# Patient Record
Sex: Male | Born: 1995 | Race: Black or African American | Hispanic: No | Marital: Single | Smoking: Never smoker
Health system: Southern US, Community
[De-identification: ages and names within clinical notes are randomized; demographics above are authoritative.]

## PROBLEM LIST (undated history)

## (undated) DIAGNOSIS — D571 Sickle-cell disease without crisis: Secondary | ICD-10-CM

---

## 2017-04-26 ENCOUNTER — Emergency Department (HOSPITAL_COMMUNITY): Payer: Medicaid Other

## 2017-04-26 ENCOUNTER — Emergency Department (HOSPITAL_COMMUNITY)
Admission: EM | Admit: 2017-04-26 | Discharge: 2017-04-26 | Disposition: A | Discharge status: Home / Self Care | Payer: Medicaid Other | Attending: Emergency Medicine | Admitting: Emergency Medicine

## 2017-04-26 ENCOUNTER — Encounter (HOSPITAL_COMMUNITY): Payer: Self-pay | Admitting: Emergency Medicine

## 2017-04-26 DIAGNOSIS — D5700 Hb-SS disease with crisis, unspecified: Secondary | ICD-10-CM | POA: Diagnosis not present

## 2017-04-26 HISTORY — DX: Sickle-cell disease without crisis: D57.1

## 2017-04-26 LAB — CBC WITH DIFFERENTIAL/PLATELET
BASOS PCT: 0 %
Basophils Absolute: 0 10*3/uL (ref 0.0–0.1)
EOS ABS: 0.1 10*3/uL (ref 0.0–0.7)
Eosinophils Relative: 1 %
HCT: 34.1 % — ABNORMAL LOW (ref 39.0–52.0)
HEMOGLOBIN: 11.4 g/dL — AB (ref 13.0–17.0)
Lymphocytes Relative: 26 %
Lymphs Abs: 2.5 10*3/uL (ref 0.7–4.0)
MCH: 23.7 pg — ABNORMAL LOW (ref 26.0–34.0)
MCHC: 33.4 g/dL (ref 30.0–36.0)
MCV: 70.9 fL — ABNORMAL LOW (ref 78.0–100.0)
MONOS PCT: 12 %
Monocytes Absolute: 1.2 10*3/uL — ABNORMAL HIGH (ref 0.1–1.0)
NEUTROS PCT: 61 %
Neutro Abs: 6.2 10*3/uL (ref 1.7–7.7)
Platelets: 140 10*3/uL — ABNORMAL LOW (ref 150–400)
RBC: 4.81 MIL/uL (ref 4.22–5.81)
RDW: 15.8 % — AB (ref 11.5–15.5)
WBC: 10 10*3/uL (ref 4.0–10.5)

## 2017-04-26 LAB — COMPREHENSIVE METABOLIC PANEL
ALK PHOS: 107 U/L (ref 38–126)
ALT: 85 U/L — AB (ref 17–63)
AST: 62 U/L — ABNORMAL HIGH (ref 15–41)
Albumin: 4.1 g/dL (ref 3.5–5.0)
Anion gap: 8 (ref 5–15)
BILIRUBIN TOTAL: 1.7 mg/dL — AB (ref 0.3–1.2)
BUN: 8 mg/dL (ref 6–20)
CALCIUM: 9.2 mg/dL (ref 8.9–10.3)
CO2: 22 mmol/L (ref 22–32)
CREATININE: 0.81 mg/dL (ref 0.61–1.24)
Chloride: 107 mmol/L (ref 101–111)
GFR calc non Af Amer: >60 mL/min (ref >60)
Glucose, Bld: 103 mg/dL — ABNORMAL HIGH (ref 65–99)
Potassium: 3.9 mmol/L (ref 3.5–5.1)
SODIUM: 137 mmol/L (ref 135–145)
TOTAL PROTEIN: 7.4 g/dL (ref 6.5–8.1)

## 2017-04-26 LAB — I-STAT TROPONIN, ED
TROPONIN I, POC: 0 ng/mL (ref 0.00–0.08)
Troponin i, poc: 0 ng/mL (ref 0.00–0.08)

## 2017-04-26 LAB — RETICULOCYTES
RBC.: 4.81 MIL/uL (ref 4.22–5.81)
Retic Count, Absolute: 206.8 10*3/uL — ABNORMAL HIGH (ref 19.0–186.0)
Retic Ct Pct: 4.3 % — ABNORMAL HIGH (ref 0.4–3.1)

## 2017-04-26 LAB — D-DIMER, QUANTITATIVE: D-Dimer, Quant: 1.91 ug/mL-FEU — ABNORMAL HIGH (ref 0.00–0.50)

## 2017-04-26 MED ORDER — ONDANSETRON HCL 4 MG/2ML IJ SOLN: 4.0000 mg | INTRAMUSCULAR | Status: DC | PRN

## 2017-04-26 MED ORDER — HYDROMORPHONE HCL 1 MG/ML IJ SOLN: 0.5000 mg | INTRAMUSCULAR | Status: AC

## 2017-04-26 MED ORDER — HYDROMORPHONE HCL 1 MG/ML IJ SOLN: 1.0000 mg | INTRAMUSCULAR | Status: DC

## 2017-04-26 MED ORDER — IOPAMIDOL (ISOVUE-370) INJECTION 76%
INTRAVENOUS | Status: AC
  Administered 2017-04-26: 100 mL
  Filled 2017-04-26: qty 100

## 2017-04-26 MED ORDER — GI COCKTAIL ~~LOC~~
30.0000 mL | Freq: Once | ORAL | Status: AC
  Administered 2017-04-26: 30 mL via ORAL
  Filled 2017-04-26: qty 30

## 2017-04-26 MED ORDER — HYDROCODONE-ACETAMINOPHEN 5-325 MG PO TABS
1.0000 | ORAL_TABLET | Freq: Once | ORAL | Status: AC
  Administered 2017-04-26: 1 via ORAL
  Filled 2017-04-26: qty 1

## 2017-04-26 MED ORDER — HYDROMORPHONE HCL 1 MG/ML IJ SOLN
0.5000 mg | INTRAMUSCULAR | Status: AC
  Administered 2017-04-26: 0.5 mg via INTRAVENOUS
  Filled 2017-04-26: qty 1

## 2017-04-26 MED ORDER — DIPHENHYDRAMINE HCL 50 MG/ML IJ SOLN
25.0000 mg | Freq: Once | INTRAMUSCULAR | Status: AC
  Administered 2017-04-26: 25 mg via INTRAVENOUS
  Filled 2017-04-26: qty 1

## 2017-04-26 MED ORDER — SODIUM CHLORIDE 0.45 % IV SOLN
INTRAVENOUS | Status: DC
  Administered 2017-04-26: 09:00:00 via INTRAVENOUS

## 2017-04-26 MED ORDER — HYDROMORPHONE HCL 1 MG/ML IJ SOLN: 0.5000 mg | Freq: Once | INTRAMUSCULAR | Status: DC

## 2017-04-26 MED ORDER — HYDROMORPHONE HCL 1 MG/ML IJ SOLN
1.0000 mg | INTRAMUSCULAR | Status: AC
  Administered 2017-04-26: 1 mg via INTRAVENOUS
  Filled 2017-04-26: qty 1

## 2017-04-26 MED ORDER — KETOROLAC TROMETHAMINE 15 MG/ML IJ SOLN
15.0000 mg | INTRAMUSCULAR | Status: AC
  Administered 2017-04-26: 15 mg via INTRAVENOUS
  Filled 2017-04-26: qty 1

## 2017-04-26 MED ORDER — HYDROMORPHONE HCL 1 MG/ML IJ SOLN
0.5000 mg | INTRAMUSCULAR | Status: AC
  Administered 2017-04-26: 0.5 mg via INTRAVENOUS

## 2017-04-26 MED ORDER — HYDROMORPHONE HCL 1 MG/ML IJ SOLN: 0.5000 mg | INTRAMUSCULAR | Status: DC

## 2017-04-26 MED ORDER — HYDROMORPHONE HCL 1 MG/ML IJ SOLN: 1.0000 mg | INTRAMUSCULAR | Status: AC

## 2017-04-26 MED ORDER — HYDROMORPHONE HCL 1 MG/ML IJ SOLN
1.0000 mg | INTRAMUSCULAR | Status: DC
  Filled 2017-04-26: qty 1

## 2017-04-26 MED ORDER — IOPAMIDOL (ISOVUE-370) INJECTION 76%
INTRAVENOUS | Status: AC
  Filled 2017-04-26: qty 100

## 2017-04-26 NOTE — ED Notes (Signed)
ED Provider at bedside. 

## 2017-04-26 NOTE — ED Notes (Signed)
CT contacted about when they will be able to get patient over for scan, Ricky Cantrell in CT advised there are 3 other patients ahead of patient at this time. PA Leaphart made aware.

## 2017-04-26 NOTE — ED Triage Notes (Signed)
Pt arrives via [facility] from home, ems reports pt originally called out for chest pain but after they spoke with patient more he admitted he had pain all over due to sickle cell. Pt reports last crisis was last week.

## 2017-04-26 NOTE — ED Provider Notes (Signed)
[facility]-EMERGENCY DEPT Provider Note   CSN: [id number] Arrival date & time: 04/26/17  0754     History   Chief Complaint Chief Complaint  Patient presents with  . Sickle Cell Pain Crisis    HPI Ricky Cantrell is a 21 y.o. male.  HPI 21 year old African-American male past medical history significant for sickle cell anemia presents to the emergency Department today with complaints of all over body pain and chest pain. Patient was at orientation for college when he began having all of her pain and specifically chest pain that sent him to the ED for evaluation by EMS. Mom at bedside states that patient did have his first sickle cell crisis last week and was admitted to the hospital at [facility] in [place] for 2-3 days. He was discharged with follow-up with hematologist. Patient has a scheduled appointment on Tuesday of next week. States that the chest pain is substernal and does not radiate. Describes this pressure. Associated with diaphoresis, nausea, emesis, shortness of breath. Does complain of allover body pain specifically in his bilateral arms and legs which is where his pain was located to his crisis last week. Has not tried anything of his home narcotic medications for the pain. Denies any lower extremity edema or calf tenderness. Denies any history of DVT/PE, prolonged immobilization, tobacco use. Pt denies any fever, chill, ha, vision changes, lightheadedness, dizziness, congestion, neck pain, sob, cough, abd pain, n/v/d, urinary symptoms, change in bowel habits, melena, hematochezia, lower extremity paresthesias.  Past Medical History:  Diagnosis Date  . Sickle cell anemia (HCC)     There are no active problems to display for this patient.   History reviewed. No pertinent surgical history.     Home Medications    Prior to Admission medications   Not on File    Family History No family history on file.  Social History Social History  Substance Use Topics  . Smoking status:  Not on file  . Smokeless tobacco: Not on file  . Alcohol use Not on file     Allergies   Patient has no known allergies.   Review of Systems Review of Systems  Constitutional: Negative for chills, diaphoresis and fever.  HENT: Negative for congestion.   Eyes: Negative for visual disturbance.  Respiratory: Negative for cough and shortness of breath.   Cardiovascular: Positive for chest pain. Negative for palpitations and leg swelling.  Gastrointestinal: Negative for abdominal pain, diarrhea, nausea and vomiting.  Genitourinary: Negative for dysuria, flank pain, frequency, hematuria and urgency.  Musculoskeletal: Positive for arthralgias and myalgias.  Skin: Negative for rash.  Neurological: Negative for dizziness, syncope, weakness, light-headedness, numbness and headaches.  Psychiatric/Behavioral: Negative for sleep disturbance. The patient is not nervous/anxious.      Physical Exam Updated Vital Signs BP 117/70   Pulse 71   Temp 98.3 F (36.8 C) (Oral)   Resp (!) 21   SpO2 97%   Physical Exam  Constitutional: He is oriented to person, place, and time. He appears well-developed and well-nourished.  Non-toxic appearance. No distress.  HENT:  Head: Normocephalic and atraumatic.  Nose: Nose normal.  Mouth/Throat: Oropharynx is clear and moist.  Eyes: Conjunctivae and EOM are normal. Pupils are equal, round, and reactive to light. Right eye exhibits no discharge. Left eye exhibits no discharge.  Neck: Normal range of motion. Neck supple. No JVD present. No tracheal deviation present.  Cardiovascular: Normal rate, regular rhythm, normal heart sounds and intact distal pulses.   Pulmonary/Chest: Effort normal and breath  sounds normal. No respiratory distress. He exhibits no tenderness.  No hypoxia or tachypnea.  Abdominal: Soft. Bowel sounds are normal. He exhibits no distension. There is no tenderness. There is no rebound and no guarding.  obese  Musculoskeletal: Normal  range of motion.  No lower extremity edema or calf tenderness.  Joints not any erythema or edema. Full range of motion. Extremity compartments are soft and nontender.  Lymphadenopathy:    He has no cervical adenopathy.  Neurological: He is alert and oriented to person, place, and time.  Skin: Skin is warm and dry. Capillary refill takes less than 2 seconds. He is not diaphoretic.  Psychiatric: His behavior is normal. Judgment and thought content normal.  Nursing note and vitals reviewed.    ED Treatments / Results  Labs (all labs ordered are listed, but only abnormal results are displayed) Labs Reviewed  RETICULOCYTES - Abnormal; Notable for the following:       Result Value   Retic Ct Pct 4.3 (*)    Retic Count, Absolute 206.8 (*)    All other components within normal limits  COMPREHENSIVE METABOLIC PANEL - Abnormal; Notable for the following:    Glucose, Bld 103 (*)    AST 62 (*)    ALT 85 (*)    Total Bilirubin 1.7 (*)    All other components within normal limits  CBC WITH DIFFERENTIAL/PLATELET - Abnormal; Notable for the following:    Hemoglobin 11.4 (*)    HCT 34.1 (*)    MCV 70.9 (*)    MCH 23.7 (*)    RDW 15.8 (*)    Platelets 140 (*)    Monocytes Absolute 1.2 (*)    All other components within normal limits  D-DIMER, QUANTITATIVE (NOT AT [facility]) - Abnormal; Notable for the following:    D-Dimer, Quant 1.91 (*)    All other components within normal limits  I-STAT TROPOININ, ED  I-STAT TROPOININ, ED    EKG  EKG Interpretation  Date/Time:  Friday April 26 2017 07:58:33 EDT Ventricular Rate:  83 PR Interval:    QRS Duration: 105 QT Interval:  392 QTC Calculation: 461 R Axis:   49 Text Interpretation:  Sinus rhythm RSR' in V1 or V2, right VCD or RVH Confirmed by Geoffery LyonseLo, Douglas ([id number]) on 04/26/2017 8:05:17 AM       Radiology No results found.  Procedures Procedures (including critical care time)  Medications Ordered in ED Medications  ketorolac  (TORADOL) 15 MG/ML injection 15 mg (15 mg Intravenous Given 04/26/17 0901)  HYDROmorphone (DILAUDID) injection 1 mg (1 mg Intravenous Given 04/26/17 1150)    Or  HYDROmorphone (DILAUDID) injection 1 mg ( Subcutaneous See Alternative 04/26/17 1150)  diphenhydrAMINE (BENADRYL) injection 25 mg (25 mg Intravenous Given 04/26/17 0901)  HYDROmorphone (DILAUDID) injection 0.5 mg (0.5 mg Intravenous Given 04/26/17 0901)    Or  HYDROmorphone (DILAUDID) injection 0.5 mg ( Subcutaneous See Alternative 04/26/17 0901)  HYDROmorphone (DILAUDID) injection 0.5 mg (0.5 mg Intravenous Given 04/26/17 1031)    Or  HYDROmorphone (DILAUDID) injection 0.5 mg ( Subcutaneous See Alternative 04/26/17 1031)  gi cocktail (Maalox,Lidocaine,Donnatal) (30 mLs Oral Given 04/26/17 1150)  iopamidol (ISOVUE-370) 76 % injection (100 mLs  Contrast Given 04/26/17 1518)  HYDROcodone-acetaminophen (NORCO/VICODIN) 5-325 MG per tablet 1 tablet (1 tablet Oral Given 04/26/17 1607)     Initial Impression / Assessment and Plan / ED Course  I have reviewed the triage vital signs and the nursing notes.  Pertinent labs & imaging results that were  available during my care of the patient were reviewed by me and considered in my medical decision making (see chart for details).     Patient presents to emergency Department with complaints of sickle cell crisis pain. Pain was all over but specifically in his chest. He was admitted to the hospital last week for his first sickle cell crisis. Was discharged with pain medicine and outpatient follow-up. States that he did improve until today. Has never had chest pain or acute chest syndrome. Denies any cough or fever. Chest x-ray shows no signs of focal infiltrate concerning for pneumonia. Hemoglobin is stable at baseline. No leukocytosis. Mild thrombocytopenia which is at baseline. Increased reticulocyte count. Labs seem consistent with acute sickle cell crisis. EKG without any ischemic changes. Negative delta  troponins. Given patient's new complaint of chest pain and recent hospitalization d-dimer was ordered. D-dimer is positive. CTA of chest was performed that demonstrated no significant pulmonary embolism. Reading does note suboptimal views of the su however I have low suspicion for PE at this time. Patient is not hypoxic, no tachypnea, no tachycardia. EKG reveals no right heart strain. Patient's pain has improved with narcotic pain medicine. He feels back at baseline. Mom and patient feels that he can go home and manage with his pain medicine at home with follow-up with his hematologist this week. Patient has no further evidence of emergent management condition at this time. He is able to ambulate for gait. He'll tolerate by mouth fluids.  Pt is hemodynamically stable, in NAD, & able to ambulate in the ED. Evaluation does not show pathology that would require ongoing emergent intervention or inpatient treatment. I explained the diagnosis to the patient. Pain has been managed & has no complaints prior to dc. Pt is comfortable with above plan and is stable for discharge at this time. All questions were answered prior to disposition. Strict return precautions for f/u to the ED were discussed. Encouraged follow up with PCP. Discussed with Dr. Judd Lien who is agreeable to above plan.   Final Clinical Impressions(s) / ED Diagnoses   Final diagnoses:  Sickle cell pain crisis [facility])    New Prescriptions Discharge Medication List as of 04/26/2017  4:46 PM       Rise Mu, PA-C 04/28/17 1635    Geoffery Lyons, MD 05/06/17 1501

## 2017-04-26 NOTE — ED Notes (Signed)
Patient given apple juice, tolerated well.

## 2017-04-26 NOTE — ED Notes (Signed)
Patient transported to X-ray 

## 2017-04-26 NOTE — Discharge Instructions (Signed)
Labs and imaging reassuring. No signs of heart attack. No evidence of blood clot. Make sure he takes his pain medicine at home. He to follow up with hematologist next week. Return to the ED if any symptoms changes including developing fever, worsening chest pain redevelops shortness of breath.

## 2017-07-22 ENCOUNTER — Ambulatory Visit: Payer: Self-pay | Admitting: Family Medicine

## 2017-08-06 ENCOUNTER — Encounter (HOSPITAL_COMMUNITY): Payer: Self-pay | Admitting: Emergency Medicine

## 2017-08-06 ENCOUNTER — Emergency Department (HOSPITAL_COMMUNITY)
Admission: EM | Admit: 2017-08-06 | Discharge: 2017-08-06 | Disposition: A | Discharge status: Home / Self Care | Payer: Medicaid Other | Attending: Emergency Medicine | Admitting: Emergency Medicine

## 2017-08-06 DIAGNOSIS — M79605 Pain in left leg: Secondary | ICD-10-CM | POA: Diagnosis present

## 2017-08-06 DIAGNOSIS — D5700 Hb-SS disease with crisis, unspecified: Secondary | ICD-10-CM | POA: Insufficient documentation

## 2017-08-06 LAB — CBC WITH DIFFERENTIAL/PLATELET
BASOS ABS: 0 10*3/uL (ref 0.0–0.1)
Basophils Relative: 0 %
Eosinophils Absolute: 0.1 10*3/uL (ref 0.0–0.7)
Eosinophils Relative: 1 %
HEMATOCRIT: 34.5 % — AB (ref 39.0–52.0)
HEMOGLOBIN: 11.7 g/dL — AB (ref 13.0–17.0)
LYMPHS PCT: 26 %
Lymphs Abs: 2.4 10*3/uL (ref 0.7–4.0)
MCH: 24.2 pg — ABNORMAL LOW (ref 26.0–34.0)
MCHC: 33.9 g/dL (ref 30.0–36.0)
MCV: 71.3 fL — AB (ref 78.0–100.0)
Monocytes Absolute: 0.7 10*3/uL (ref 0.1–1.0)
Monocytes Relative: 7 %
NEUTROS ABS: 6 10*3/uL (ref 1.7–7.7)
Neutrophils Relative %: 65 %
Platelets: 148 10*3/uL — ABNORMAL LOW (ref 150–400)
RBC: 4.84 MIL/uL (ref 4.22–5.81)
RDW: 16.4 % — ABNORMAL HIGH (ref 11.5–15.5)
WBC: 9.2 10*3/uL (ref 4.0–10.5)

## 2017-08-06 LAB — COMPREHENSIVE METABOLIC PANEL
ALK PHOS: 111 U/L (ref 38–126)
ALT: 72 U/L — AB (ref 17–63)
AST: 51 U/L — AB (ref 15–41)
Albumin: 4.2 g/dL (ref 3.5–5.0)
Anion gap: 9 (ref 5–15)
BILIRUBIN TOTAL: 1.3 mg/dL — AB (ref 0.3–1.2)
BUN: 11 mg/dL (ref 6–20)
CHLORIDE: 105 mmol/L (ref 101–111)
CO2: 24 mmol/L (ref 22–32)
CREATININE: 0.84 mg/dL (ref 0.61–1.24)
Calcium: 9.2 mg/dL (ref 8.9–10.3)
GFR calc Af Amer: >60 mL/min (ref >60)
Glucose, Bld: 131 mg/dL — ABNORMAL HIGH (ref 65–99)
Potassium: 4 mmol/L (ref 3.5–5.1)
Sodium: 138 mmol/L (ref 135–145)
Total Protein: 7.7 g/dL (ref 6.5–8.1)

## 2017-08-06 LAB — RETICULOCYTES
RBC.: 4.84 MIL/uL (ref 4.22–5.81)
RETIC CT PCT: 4.5 % — AB (ref 0.4–3.1)
Retic Count, Absolute: 217.8 10*3/uL — ABNORMAL HIGH (ref 19.0–186.0)

## 2017-08-06 MED ORDER — SODIUM CHLORIDE 0.45 % IV SOLN
INTRAVENOUS | Status: DC
  Administered 2017-08-06: 05:00:00 via INTRAVENOUS

## 2017-08-06 MED ORDER — OXYCODONE-ACETAMINOPHEN 5-325 MG PO TABS
1.0000 | ORAL_TABLET | Freq: Once | ORAL | Status: AC
  Administered 2017-08-06: 1 via ORAL
  Filled 2017-08-06: qty 1

## 2017-08-06 MED ORDER — HYDROMORPHONE HCL 1 MG/ML IJ SOLN
1.0000 mg | Freq: Once | INTRAMUSCULAR | Status: AC
  Administered 2017-08-06: 1 mg via INTRAVENOUS
  Filled 2017-08-06: qty 1

## 2017-08-06 NOTE — ED Provider Notes (Signed)
[facility]-EMERGENCY DEPT Provider Note   CSN: [id number] Arrival date & time: 08/06/17  4010     History   Chief Complaint Chief Complaint  Patient presents with  . Sickle Cell Pain Crisis    HPI Ricky Cantrell is a 21 y.o. male.  The history is provided by the patient and medical records.  Sickle Cell Pain Crisis     21 year old male with history of sickle cell anemia, presenting to the ED with left leg pain.  Patient states pain mostly in the left upper leg, began a few hours ago.  States pain is throbbing in nature. He also feels like pain is making him "jittery". No seizure activity. He denies any swelling or fever. No history of DVT. No chest pain, shortness of breath, cough, or other upper respiratory symptoms. Patient states he takes ibuprofen at home as well as oxycodone as needed. He did not try taking his home meds prior to coming here.  States his symptoms feels typical of his sickle cell pain crisis.  Reports he does not get these frequently, usually every few months or so.  Past Medical History:  Diagnosis Date  . Sickle cell anemia (HCC)     There are no active problems to display for this patient.   History reviewed. No pertinent surgical history.     Home Medications    Prior to Admission medications   Medication Sig Start Date End Date Taking? Authorizing Provider  folic acid (FOLVITE) 1 MG tablet Take 1 mg by mouth daily.    [provider]    Family History History reviewed. No pertinent family history.  Social History Social History  Substance Use Topics  . Smoking status: Never Smoker  . Smokeless tobacco: Never Used  . Alcohol use Not on file     Allergies   Patient has no known allergies.   Review of Systems Review of Systems  Musculoskeletal: Positive for arthralgias.  All other systems reviewed and are negative.    Physical Exam Updated Vital Signs BP 137/66 (BP Location: Left Arm)   Pulse 82   Temp 98.3 F (36.8 C)  (Oral)   Resp 20   SpO2 98%   Physical Exam  Constitutional: He is oriented to person, place, and time. He appears well-developed and well-nourished.  Obese, NAD  HENT:  Head: Normocephalic and atraumatic.  Mouth/Throat: Oropharynx is clear and moist.  Eyes: Pupils are equal, round, and reactive to light. Conjunctivae and EOM are normal.  Neck: Normal range of motion.  Cardiovascular: Normal rate, regular rhythm and normal heart sounds.   Pulmonary/Chest: Effort normal and breath sounds normal.  Abdominal: Soft. Bowel sounds are normal.  Musculoskeletal: Normal range of motion.  Left leg without apparent swelling or erythema, no bony deformities, no focal tendernes  Neurological: He is alert and oriented to person, place, and time.  Skin: Skin is warm and dry.  Psychiatric: He has a normal mood and affect.  Nursing note and vitals reviewed.    ED Treatments / Results  Labs (all labs ordered are listed, but only abnormal results are displayed) Labs Reviewed  CBC WITH DIFFERENTIAL/PLATELET - Abnormal; Notable for the following:       Result Value   Hemoglobin 11.7 (*)    HCT 34.5 (*)    MCV 71.3 (*)    MCH 24.2 (*)    RDW 16.4 (*)    Platelets 148 (*)    All other components within normal limits  COMPREHENSIVE METABOLIC PANEL -  Abnormal; Notable for the following:    Glucose, Bld 131 (*)    AST 51 (*)    ALT 72 (*)    Total Bilirubin 1.3 (*)    All other components within normal limits  RETICULOCYTES - Abnormal; Notable for the following:    Retic Ct Pct 4.5 (*)    Retic Count, Absolute 217.8 (*)    All other components within normal limits    EKG  EKG Interpretation None       Radiology No results found.  Procedures Procedures (including critical care time)  Medications Ordered in ED Medications  0.45 % sodium chloride infusion ( Intravenous New Bag/Given 08/06/17 0442)  HYDROmorphone (DILAUDID) injection 1 mg (1 mg Intravenous Given 08/06/17 0442)    oxyCODONE-acetaminophen (PERCOCET/ROXICET) 5-325 MG per tablet 1 tablet (1 tablet Oral Given 08/06/17 0615)     Initial Impression / Assessment and Plan / ED Course  I have reviewed the triage vital signs and the nursing notes.  Pertinent labs & imaging results that were available during my care of the patient were reviewed by me and considered in my medical decision making (see chart for details).  21 y.o. M here with left leg pain, reports it feels like sickle cell pain crisis.  Reports has these Every few months or so. No recent illness. No chest pain, shortness of breath, or fever. No swelling or deformities noted of the left leg. We'll plan for screening labs, IV fluids, pain control.  After 1 dose of dilaudid, patient now sleeping comfortably.  We have reviewed his lab results.  Vitals remain stable.  Feel he is appropriate for discharge home.  Will have him continue his home meds, can follow-up with PCP.  Discussed plan with patient, he acknowledged understanding and agreed with plan of care.  Return precautions given for new or worsening symptoms.  Final Clinical Impressions(s) / ED Diagnoses   Final diagnoses:  Sickle cell pain crisis [facility])    New Prescriptions New Prescriptions   No medications on file     Oletha Blend 08/06/17 0630    Palumbo, April, MD 08/06/17 [phone]

## 2017-08-06 NOTE — Discharge Instructions (Signed)
Continue your home medications. °Follow-up with your primary care doctor. °Return to the ED for new or worsening symptoms. °

## 2017-08-06 NOTE — ED Triage Notes (Signed)
Patient BIB [facility] for sickle cell pain in his left leg above the knee that started a couple hours ago. Pt reports his leg started shaking and his pain is 8/10. Pt tells EMS this happens every few months.

## 2017-08-07 ENCOUNTER — Emergency Department (HOSPITAL_COMMUNITY): Payer: Medicaid Other

## 2017-08-07 ENCOUNTER — Encounter (HOSPITAL_COMMUNITY): Payer: Self-pay

## 2017-08-07 ENCOUNTER — Emergency Department (HOSPITAL_COMMUNITY)
Admission: EM | Admit: 2017-08-07 | Discharge: 2017-08-07 | Disposition: A | Discharge status: Home / Self Care | Payer: Medicaid Other | Source: Home / Self Care | Attending: Emergency Medicine | Admitting: Emergency Medicine

## 2017-08-07 DIAGNOSIS — D5700 Hb-SS disease with crisis, unspecified: Secondary | ICD-10-CM

## 2017-08-07 LAB — COMPREHENSIVE METABOLIC PANEL
ALT: 66 U/L — AB (ref 17–63)
ANION GAP: 10 (ref 5–15)
AST: 46 U/L — ABNORMAL HIGH (ref 15–41)
Albumin: 4.4 g/dL (ref 3.5–5.0)
Alkaline Phosphatase: 108 U/L (ref 38–126)
BUN: 8 mg/dL (ref 6–20)
CHLORIDE: 104 mmol/L (ref 101–111)
CO2: 23 mmol/L (ref 22–32)
CREATININE: 0.86 mg/dL (ref 0.61–1.24)
Calcium: 9.5 mg/dL (ref 8.9–10.3)
GFR calc Af Amer: >60 mL/min (ref >60)
Glucose, Bld: 106 mg/dL — ABNORMAL HIGH (ref 65–99)
POTASSIUM: 4.1 mmol/L (ref 3.5–5.1)
SODIUM: 137 mmol/L (ref 135–145)
Total Bilirubin: 1.9 mg/dL — ABNORMAL HIGH (ref 0.3–1.2)
Total Protein: 8 g/dL (ref 6.5–8.1)

## 2017-08-07 LAB — RETICULOCYTES
RBC.: 5.2 MIL/uL (ref 4.22–5.81)
RETIC COUNT ABSOLUTE: 254.8 10*3/uL — AB (ref 19.0–186.0)
Retic Ct Pct: 4.9 % — ABNORMAL HIGH (ref 0.4–3.1)

## 2017-08-07 LAB — CBC WITH DIFFERENTIAL/PLATELET
Basophils Absolute: 0 10*3/uL (ref 0.0–0.1)
Basophils Relative: 0 %
Eosinophils Absolute: 0.1 10*3/uL (ref 0.0–0.7)
Eosinophils Relative: 1 %
HEMATOCRIT: 36.6 % — AB (ref 39.0–52.0)
HEMOGLOBIN: 12.4 g/dL — AB (ref 13.0–17.0)
LYMPHS ABS: 2.7 10*3/uL (ref 0.7–4.0)
LYMPHS PCT: 29 %
MCH: 23.8 pg — ABNORMAL LOW (ref 26.0–34.0)
MCHC: 33.9 g/dL (ref 30.0–36.0)
MCV: 70.4 fL — AB (ref 78.0–100.0)
MONOS PCT: 10 %
Monocytes Absolute: 0.9 10*3/uL (ref 0.1–1.0)
NEUTROS ABS: 5.6 10*3/uL (ref 1.7–7.7)
NEUTROS PCT: 60 %
PLATELETS: 165 10*3/uL (ref 150–400)
RBC: 5.2 MIL/uL (ref 4.22–5.81)
RDW: 16.4 % — AB (ref 11.5–15.5)
WBC: 9.2 10*3/uL (ref 4.0–10.5)

## 2017-08-07 MED ORDER — HYDROMORPHONE HCL 1 MG/ML IJ SOLN: 1.0000 mg | INTRAMUSCULAR | Status: AC

## 2017-08-07 MED ORDER — HYDROMORPHONE HCL 1 MG/ML IJ SOLN
1.0000 mg | INTRAMUSCULAR | Status: AC
  Administered 2017-08-07: 1 mg via INTRAVENOUS
  Filled 2017-08-07: qty 1

## 2017-08-07 MED ORDER — HYDROMORPHONE HCL 1 MG/ML IJ SOLN: 0.5000 mg | INTRAMUSCULAR | Status: AC

## 2017-08-07 MED ORDER — OXYCODONE HCL 5 MG PO TABS: 5.0000 mg | ORAL_TABLET | ORAL | 0 refills | Status: AC | PRN

## 2017-08-07 MED ORDER — KETOROLAC TROMETHAMINE 15 MG/ML IJ SOLN
15.0000 mg | INTRAMUSCULAR | Status: AC
  Administered 2017-08-07: 15 mg via INTRAVENOUS
  Filled 2017-08-07: qty 1

## 2017-08-07 MED ORDER — ACETAMINOPHEN 500 MG PO TABS
1000.0000 mg | ORAL_TABLET | Freq: Once | ORAL | Status: AC
  Administered 2017-08-07: 1000 mg via ORAL
  Filled 2017-08-07: qty 2

## 2017-08-07 MED ORDER — DEXTROSE 5 % AND 0.45 % NACL IV BOLUS
1000.0000 mL | Freq: Once | INTRAVENOUS | Status: AC
  Administered 2017-08-07: 1000 mL via INTRAVENOUS

## 2017-08-07 MED ORDER — HYDROMORPHONE HCL 1 MG/ML IJ SOLN
0.5000 mg | INTRAMUSCULAR | Status: AC
  Administered 2017-08-07: 0.5 mg via INTRAVENOUS
  Filled 2017-08-07: qty 1

## 2017-08-07 NOTE — Discharge Instructions (Signed)
For pain: take 1000 mg tylenol every 6-8 hours. Additionally, you can take oxycodone  every 4 hours.    Contact your sickle cell doctor and make an appointment for further medication refills.   Return to ED for worsening uncontrollable pain, fevers, chills, severe headache, visual changes, chest pain, groin pain

## 2017-08-07 NOTE — ED Notes (Signed)
Unable to collect labs patient wants his blood collect when they start his IV

## 2017-08-07 NOTE — ED Triage Notes (Signed)
Pt was seen for the same last night, he complains of left leg pain, he was discharged at 7am Pt states his pain didn't get any better

## 2017-08-07 NOTE — ED Notes (Signed)
Pt vitals wasn't taken because pt was in room 3 and just moved to room 9 at 6:50am and was taken to x-ray.

## 2017-08-07 NOTE — ED Provider Notes (Signed)
[facility]-EMERGENCY DEPT Provider Note   CSN: [id number] Arrival date & time: 08/06/17  2325     History   Chief Complaint Chief Complaint  Patient presents with  . Sickle Cell Pain Crisis    HPI Ricky Cantrell is a 21 y.o. male with history of sickle cell anemia and obesity presents to ED for evaluation of worsening left leg pain 2 days. Chart review reveals patient was just discharged from [facility] ED 3 hours ago after one dose of Dilaudid. Pain is from the left knee up into his hip, does not radiate to his abdomen or his back. Has not tried any ibuprofen, Tylenol or oxycodone that he usually takes for sickle cell pain. States he ran out of oxycodone. No fevers, chills, changes in vision, headache, neck pain or stiffness, chest pain, shortness of breath, abdominal pain, groin pain, lower extremity edema, numbness or weakness to lower extremities. Denies preceding trauma. No previous history of DVT/PE. No recent prolonged travel or immune bolus sedation, malignancy. Patient is a poor historian, states he moved to the area to go to college states he has a sickle cell doctor in [place] but has not followed up in a while.  Chart review reveals patient is followed by [facility] Hematology/oncology.  Some medical non compliance noted. He is not on any meds for sickle cell per patient other than ibuprofen prn.  Last saw [facility] provider June/July does not have upcoming appointment scheduled. Last admission for sickle cell crisis 11/2016.  HPI  Past Medical History:  Diagnosis Date  . Sickle cell anemia (HCC)     There are no active problems to display for this patient.   History reviewed. No pertinent surgical history.     Home Medications    Prior to Admission medications   Medication Sig Start Date End Date Taking? Authorizing Provider  ibuprofen (ADVIL,MOTRIN) 200 MG tablet Take 600 mg by mouth every 6 (six) hours as needed for moderate pain.   Yes [provider]  oxyCODONE  (ROXICODONE) 5 MG immediate release tablet Take 1 tablet (5 mg total) by mouth every 4 (four) hours as needed for severe pain. 08/07/17 08/10/17  Liberty Handy, PA-C    Family History History reviewed. No pertinent family history.  Social History Social History  Substance Use Topics  . Smoking status: Never Smoker  . Smokeless tobacco: Never Used  . Alcohol use No     Allergies   Patient has no known allergies.   Review of Systems Review of Systems  Constitutional: Negative for fever.  HENT: Negative for sore throat.   Eyes: Negative for visual disturbance.  Respiratory: Negative for cough, chest tightness and shortness of breath.   Cardiovascular: Negative for chest pain and leg swelling.  Gastrointestinal: Negative for abdominal pain, diarrhea, nausea and vomiting.  Genitourinary: Negative for difficulty urinating, dysuria, flank pain, penile pain and penile swelling.  Musculoskeletal: Positive for arthralgias and myalgias. Negative for back pain, joint swelling, neck pain and neck stiffness.  Skin: Negative for color change and pallor.  Allergic/Immunologic: Positive for immunocompromised state.  Neurological: Negative for facial asymmetry, speech difficulty, weakness, light-headedness, numbness and headaches.     Physical Exam Updated Vital Signs BP (!) 133/37 (BP Location: Right Arm)   Pulse 60   Temp 99.2 F (37.3 C) (Oral)   Resp 17   Ht  (1.93 m)   Wt (!) 154.2 kg (340 lb)   SpO2 100%   BMI 41.39 kg/m   Physical Exam  Constitutional: Vital signs are normal. He appears well-developed.  Non-toxic appearance.  Asleep. Obese male.   HENT:  Moist mucous membranes   Eyes:  PERRL and EOMs intact No scleral incterus  Neck: Full passive range of motion without pain. No neck rigidity.  No cervical adenopathy   Cardiovascular: Normal rate, regular rhythm, S1 normal, S2 normal and normal heart sounds.   No murmur heard. Pulses:      Carotid pulses are  2+ on the right side, and 2+ on the left side.      Radial pulses are 2+ on the right side, and 2+ on the left side.       Dorsalis pedis pulses are 2+ on the right side, and 2+ on the left side.  No asymmetric LE edema  <2 capillary refill in fingers and toes No calf tenderness  Pulmonary/Chest: Effort normal. He has no decreased breath sounds. He has no wheezes. He has no rhonchi. He has no rales. He exhibits no tenderness.  Abdominal: Soft. Normal appearance and bowel sounds are normal. He exhibits no distension. There is no tenderness. There is no CVA tenderness.  Musculoskeletal: He exhibits tenderness.  Full PROM of bilateral LE, pain with left hip and knee flexion and extension. Joints w/o edema, erythema, fluctuance, cellulitis. UE and LE exposed, without erythema, edema, or warmth   Neurological: He is alert.  Skin: Skin is warm and dry. Capillary refill takes less than 2 seconds.  Psychiatric: He has a normal mood and affect. His speech is normal and behavior is normal. Judgment and thought content normal. Cognition and memory are normal.     ED Treatments / Results  Labs (all labs ordered are listed, but only abnormal results are displayed) Labs Reviewed  COMPREHENSIVE METABOLIC PANEL - Abnormal; Notable for the following:       Result Value   Glucose, Bld 106 (*)    AST 46 (*)    ALT 66 (*)    Total Bilirubin 1.9 (*)    All other components within normal limits  CBC WITH DIFFERENTIAL/PLATELET - Abnormal; Notable for the following:    Hemoglobin 12.4 (*)    HCT 36.6 (*)    MCV 70.4 (*)    MCH 23.8 (*)    RDW 16.4 (*)    All other components within normal limits  RETICULOCYTES - Abnormal; Notable for the following:    Retic Ct Pct 4.9 (*)    Retic Count, Absolute 254.8 (*)    All other components within normal limits    EKG  EKG Interpretation None       Radiology Dg Hip Unilat W Or Wo Pelvis 2-3 Views Left  Result Date: 08/07/2017 CLINICAL DATA:  Sickle  cell pain crisis with worsening left hip and leg pain over the past 2 days. EXAM: DG HIP (WITH OR WITHOUT PELVIS) 2-3V LEFT COMPARISON:  None in PACs FINDINGS: The bones are subjectively adequately mineralized. There is no acute fracture or dislocation. There is increased density within the left femoral head. The roof of the acetabulum exhibits increased density as well. The articular surfaces of the femoral head and acetabulum remain smoothly rounded. The femoral neck and intertrochanteric and subtrochanteric regions are normal. 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 No evidence of articular surface collapse is seen. Electronically Signed   By: David  [place] M.D.   On: 08/07/2017 07:25    Procedures  Procedures (including critical care time)  Medications Ordered in ED Medications  HYDROmorphone (DILAUDID) injection 1 mg (1 mg Intravenous Given 08/07/17 0924)    Or  HYDROmorphone (DILAUDID) injection 1 mg ( Subcutaneous See Alternative 08/07/17 0924)  dextrose 5 % and 0.45% NaCl 5-0.45 % bolus 1,000 mL (0 mLs Intravenous Stopped 08/07/17 0832)  ketorolac (TORADOL) 15 MG/ML injection 15 mg (15 mg Intravenous Given 08/07/17 0733)  HYDROmorphone (DILAUDID) injection 0.5 mg (0.5 mg Intravenous Given 08/07/17 1610)    Or  HYDROmorphone (DILAUDID) injection 0.5 mg ( Subcutaneous See Alternative 08/07/17 0635)  HYDROmorphone (DILAUDID) injection 1 mg (1 mg Intravenous Given 08/07/17 0733)    Or  HYDROmorphone (DILAUDID) injection 1 mg ( Subcutaneous See Alternative 08/07/17 0733)  HYDROmorphone (DILAUDID) injection 1 mg (1 mg Intravenous Given 08/07/17 0803)    Or  HYDROmorphone (DILAUDID) injection 1 mg ( Subcutaneous See Alternative 08/07/17 0803)  acetaminophen (TYLENOL) tablet 1,000 mg (1,000 mg Oral Given 08/07/17 9604)     Initial Impression / Assessment and Plan / ED Course  I have reviewed  the triage vital signs and the nursing notes.  Pertinent labs & imaging results that were available during my care of the patient were reviewed by me and considered in my medical decision making (see chart for details).  Clinical Course as of Aug 07 1202  Wed Aug 07, 2017  0800 Reassessed patient; pain 6/10 from 8/10   [CG]  1122 Reevaluated patient. Pain is a 4/10. He feels comfortable going home.  [CG]    Clinical Course User Index [CG] Liberty Handy, PA-C   Patient with history of sickle cell anemia presents with pain to LLE from knee to hip. Worse with ROM and weight baring, similar to previous symptoms of SCD pain crisis. He was discharged from ED for same just hours ago, went to school and pain got worse.  He did not try any medications PTA. Patient is non toxic appearing. Patient is HD stable. Lab work from previous ED visit less than 3 hours ago reviewed, and physical exam findings most consistent with vaso-occlusive pain episode. Infection, ACS, PE, or stroke considered but not consistent with ED work up and clinical picture.  Lab work so far is similar to baseline labs. Left hip x-ray done, he has signs of AVN probably not new.  Extremity is NVI. I discussed this with patient and advised him to f/u with SCD doctor. He verablized understanding and is agreeable. Patient responded to IV narcotics with partial resolution of pain to tolerable level. I personally ambulated him in room and he feels well enough for discharge.  ED return precautions given. Patient verbalized understanding and will f/u with SCD provider. Rx for oxycodone refilled today. He needs to f/u with SCD for medication refills, he is aware.   Final Clinical Impressions(s) / ED Diagnoses   Final diagnoses:  Sickle cell pain crisis (HCC)    New Prescriptions New Prescriptions   OXYCODONE (ROXICODONE) 5 MG IMMEDIATE RELEASE TABLET    Take 1 tablet (5 mg total) by mouth every 4 (four) hours as needed for severe pain.      Liberty Handy, PA-C 08/07/17 1203    Dione Booze, MD 08/08/17 [phone]

## 2018-07-16 IMAGING — DX DG CHEST 2V
2 series · 2 of 2 positions shown · non-contrast
Comparison: None.

CLINICAL DATA: Chest pain.  Sickle cell anemia.

EXAM:
CHEST  2 VIEW

[chest lat]
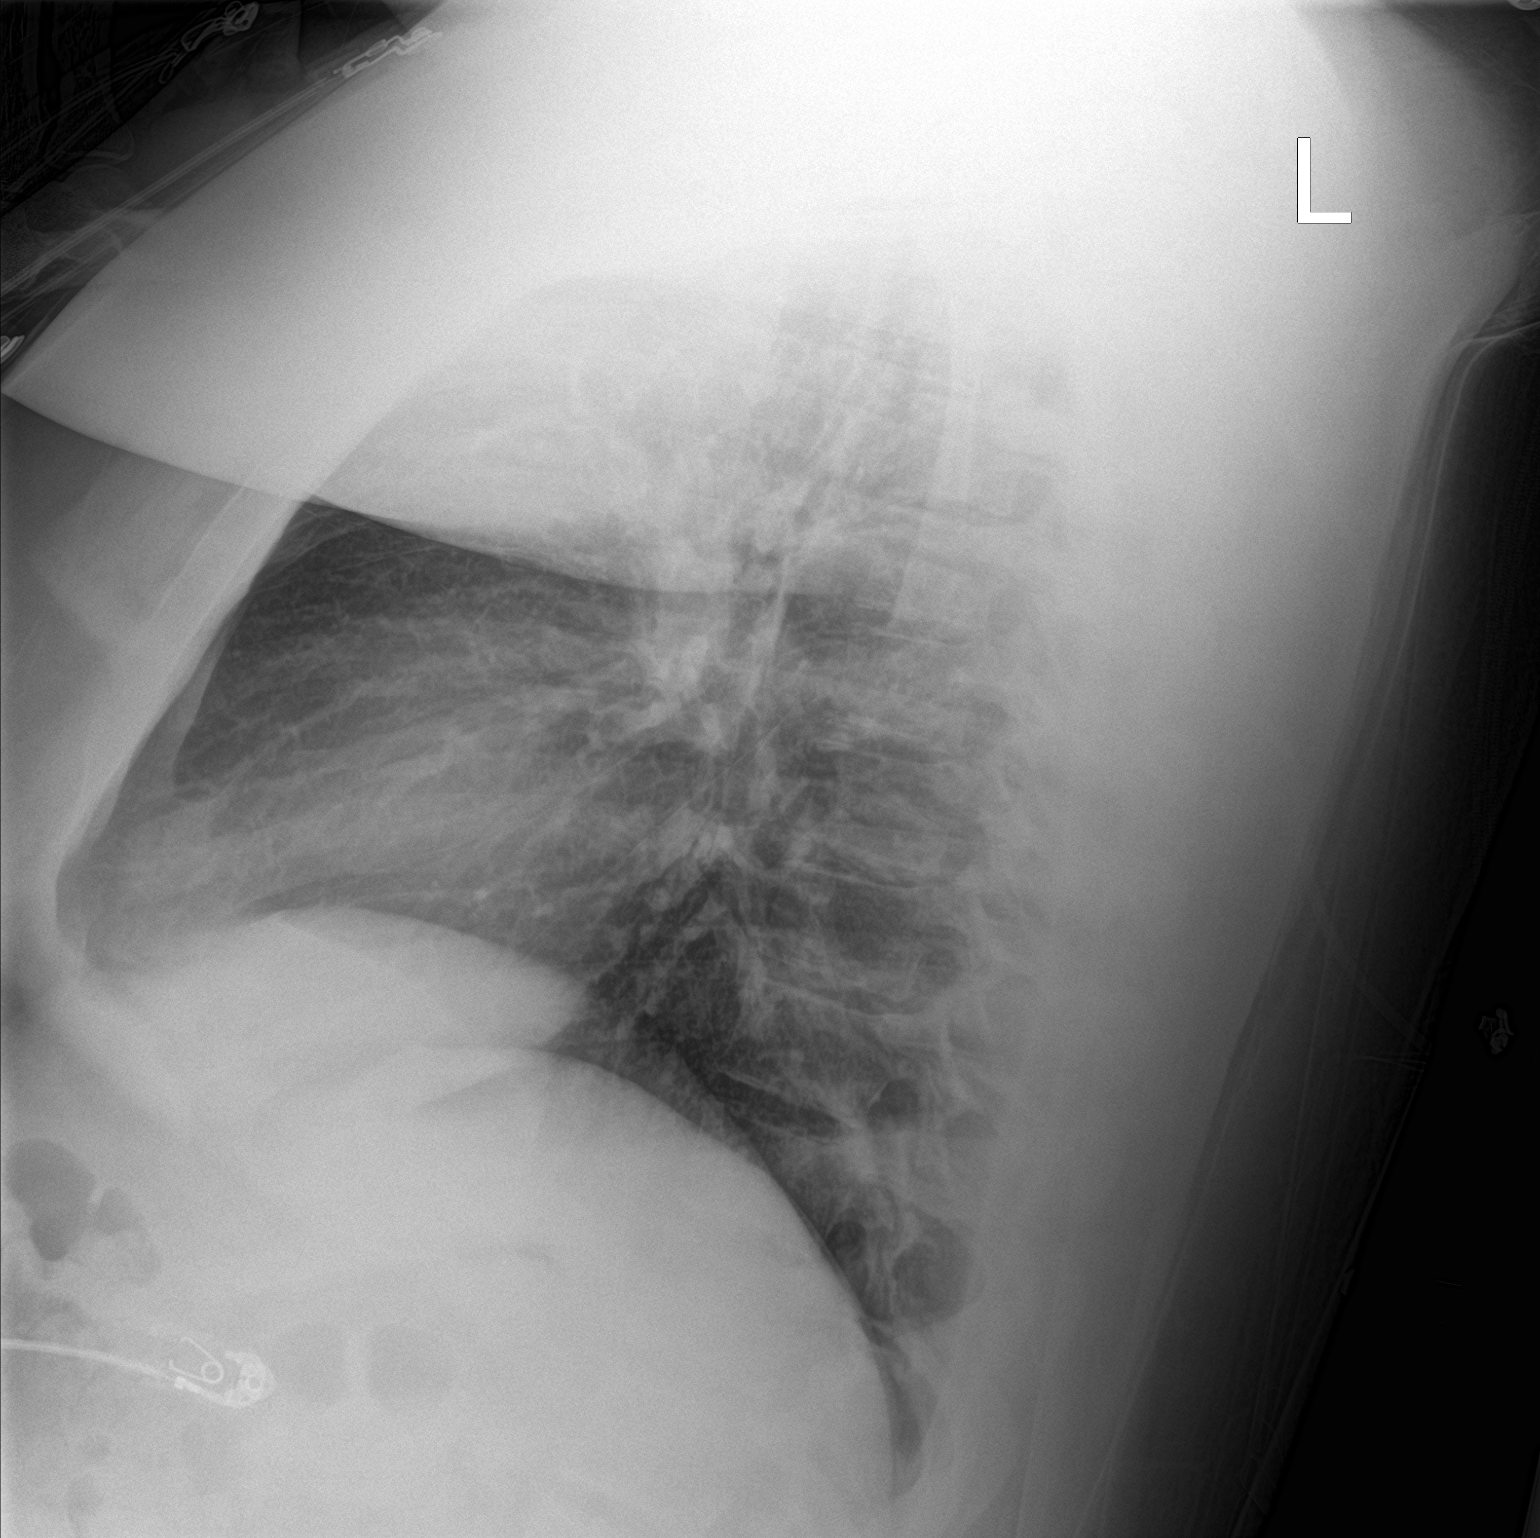

[chest ap]
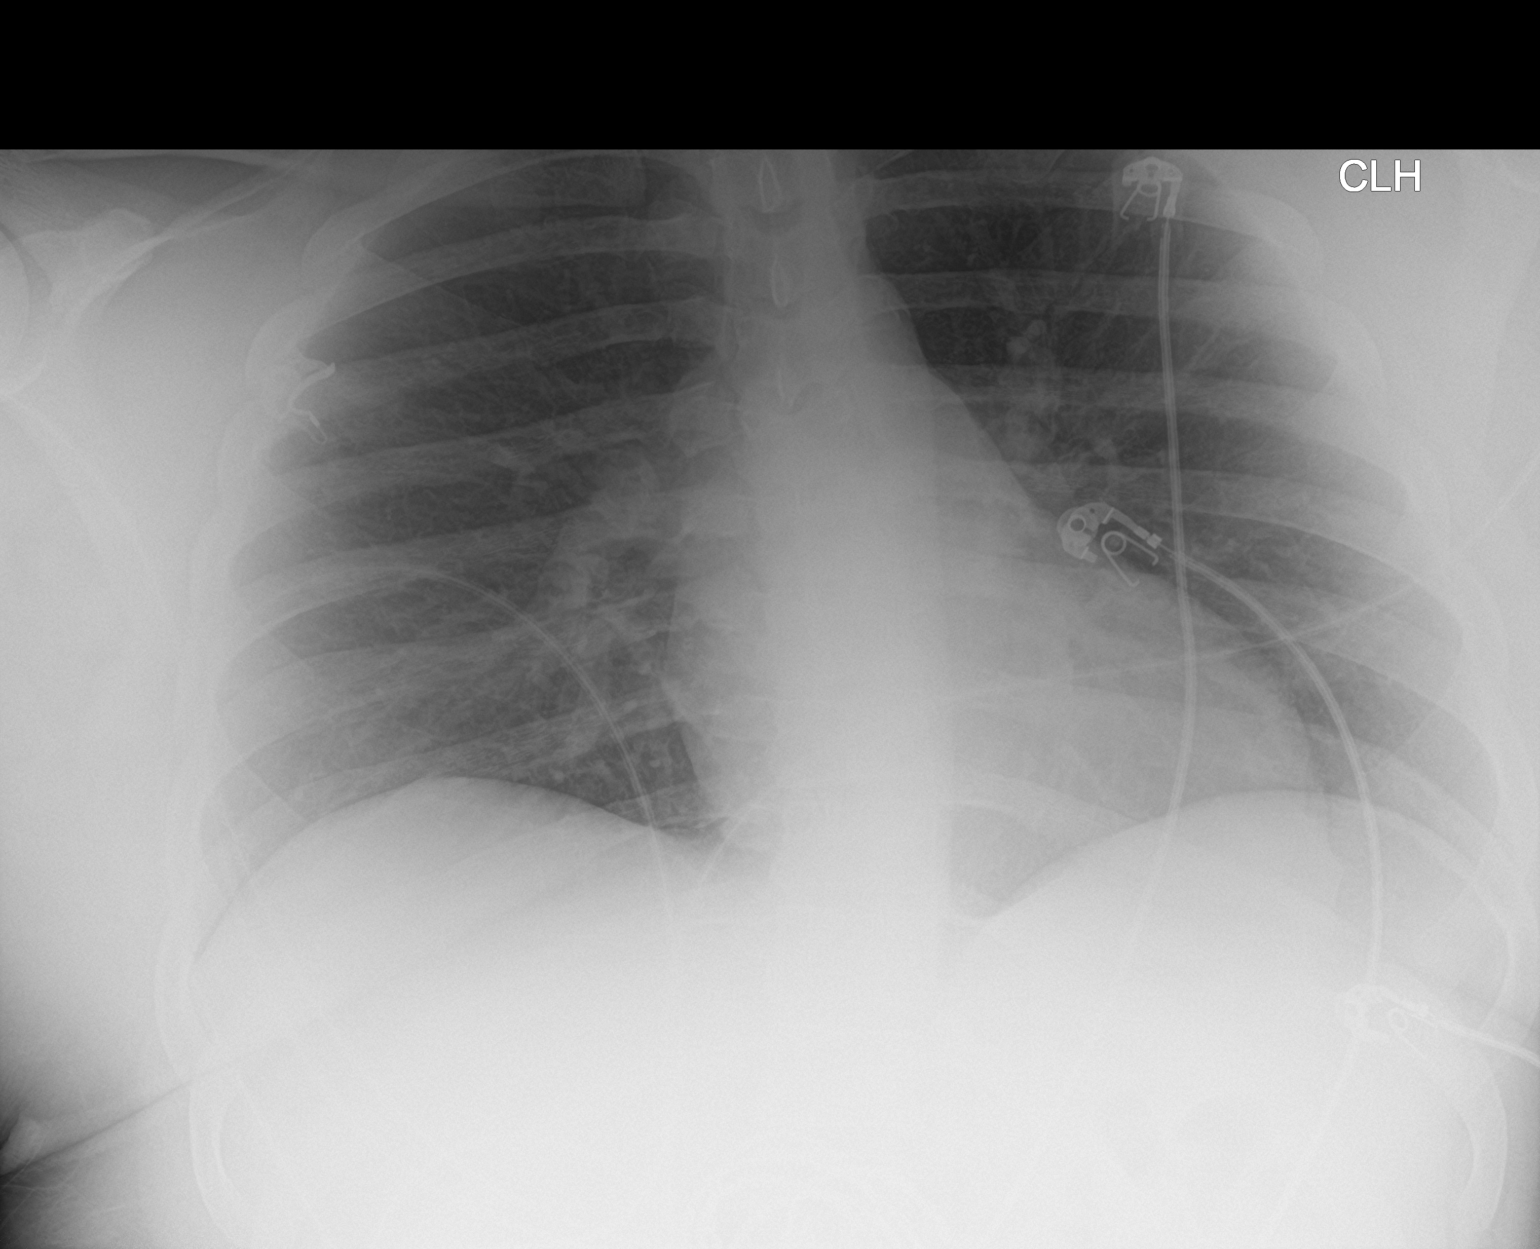

[2 of 2 positions shown; findings below may reference images not displayed]

FINDINGS: The cardiomediastinal silhouette is within normal limits. The lungs
are adequately inflated without evidence of airspace consolidation,
edema, pleural effusion, or pneumothorax. No acute osseous
abnormality is seen.
IMPRESSION: No active cardiopulmonary disease.

## 2020-11-14 ENCOUNTER — Ambulatory Visit: Payer: Medicaid Other | Attending: Family

## 2020-11-14 DIAGNOSIS — Z23 Encounter for immunization: Secondary | ICD-10-CM

## 2021-03-16 NOTE — Progress Notes (Signed)
   Covid-19 Vaccination Clinic  Name:  Ricky Cantrell    MRN: [id number] DOB: 1996-05-18  03/16/2021  Mr. [name] was observed post Covid-19 immunization for 15 minutes without incident. He was provided with Vaccine Information Sheet and instruction to access the V-Safe system.   Mr. [name] was instructed to call 911 with any severe reactions post vaccine: Marland Kitchen Difficulty breathing  . Swelling of face and throat  . A fast heartbeat  . A bad rash all over body  . Dizziness and weakness   Immunizations Administered    Name Date Dose VIS Date Route   Moderna COVID-19 Vaccine 11/14/2020 12:45 PM 0.5 mL 08/24/2020 Intramuscular   Manufacturer: Moderna   Lot: [id number]   NDC: 75643-329-51

## 2021-09-23 ENCOUNTER — Emergency Department (HOSPITAL_COMMUNITY): Payer: BC Managed Care – PPO

## 2021-09-23 ENCOUNTER — Inpatient Hospital Stay (HOSPITAL_COMMUNITY)
Admission: EM | Admit: 2021-09-23 | Discharge: 2021-09-28 | DRG: 811 | Disposition: A | Discharge status: Home / Self Care | Payer: BC Managed Care – PPO | Attending: Internal Medicine | Admitting: Internal Medicine

## 2021-09-23 ENCOUNTER — Other Ambulatory Visit: Payer: Self-pay

## 2021-09-23 ENCOUNTER — Encounter (HOSPITAL_COMMUNITY): Payer: Self-pay | Admitting: Emergency Medicine

## 2021-09-23 DIAGNOSIS — Z20822 Contact with and (suspected) exposure to covid-19: Secondary | ICD-10-CM | POA: Diagnosis present

## 2021-09-23 DIAGNOSIS — Z8249 Family history of ischemic heart disease and other diseases of the circulatory system: Secondary | ICD-10-CM

## 2021-09-23 DIAGNOSIS — L83 Acanthosis nigricans: Secondary | ICD-10-CM | POA: Diagnosis present

## 2021-09-23 DIAGNOSIS — R739 Hyperglycemia, unspecified: Secondary | ICD-10-CM

## 2021-09-23 DIAGNOSIS — R778 Other specified abnormalities of plasma proteins: Secondary | ICD-10-CM | POA: Diagnosis not present

## 2021-09-23 DIAGNOSIS — D6959 Other secondary thrombocytopenia: Secondary | ICD-10-CM | POA: Diagnosis present

## 2021-09-23 DIAGNOSIS — M87852 Other osteonecrosis, left femur: Secondary | ICD-10-CM | POA: Diagnosis present

## 2021-09-23 DIAGNOSIS — D72828 Other elevated white blood cell count: Secondary | ICD-10-CM | POA: Diagnosis present

## 2021-09-23 DIAGNOSIS — R9431 Abnormal electrocardiogram [ECG] [EKG]: Secondary | ICD-10-CM | POA: Diagnosis not present

## 2021-09-23 DIAGNOSIS — D5700 Hb-SS disease with crisis, unspecified: Principal | ICD-10-CM

## 2021-09-23 DIAGNOSIS — E1165 Type 2 diabetes mellitus with hyperglycemia: Secondary | ICD-10-CM | POA: Diagnosis present

## 2021-09-23 DIAGNOSIS — R7989 Other specified abnormal findings of blood chemistry: Secondary | ICD-10-CM

## 2021-09-23 DIAGNOSIS — I319 Disease of pericardium, unspecified: Secondary | ICD-10-CM | POA: Diagnosis present

## 2021-09-23 DIAGNOSIS — Z794 Long term (current) use of insulin: Secondary | ICD-10-CM | POA: Diagnosis not present

## 2021-09-23 DIAGNOSIS — Z833 Family history of diabetes mellitus: Secondary | ICD-10-CM | POA: Diagnosis not present

## 2021-09-23 DIAGNOSIS — R0602 Shortness of breath: Secondary | ICD-10-CM

## 2021-09-23 DIAGNOSIS — D638 Anemia in other chronic diseases classified elsewhere: Secondary | ICD-10-CM | POA: Diagnosis present

## 2021-09-23 DIAGNOSIS — K760 Fatty (change of) liver, not elsewhere classified: Secondary | ICD-10-CM | POA: Diagnosis present

## 2021-09-23 DIAGNOSIS — E871 Hypo-osmolality and hyponatremia: Secondary | ICD-10-CM | POA: Diagnosis present

## 2021-09-23 DIAGNOSIS — J189 Pneumonia, unspecified organism: Secondary | ICD-10-CM | POA: Diagnosis present

## 2021-09-23 DIAGNOSIS — R079 Chest pain, unspecified: Secondary | ICD-10-CM

## 2021-09-23 DIAGNOSIS — D57819 Other sickle-cell disorders with crisis, unspecified: Secondary | ICD-10-CM | POA: Diagnosis not present

## 2021-09-23 DIAGNOSIS — M87851 Other osteonecrosis, right femur: Secondary | ICD-10-CM | POA: Diagnosis present

## 2021-09-23 DIAGNOSIS — Z6841 Body Mass Index (BMI) 40.0 and over, adult: Secondary | ICD-10-CM

## 2021-09-23 LAB — CBC WITH DIFFERENTIAL/PLATELET
Abs Immature Granulocytes: 1.7 10*3/uL — ABNORMAL HIGH (ref 0.00–0.07)
Basophils Absolute: 0.1 10*3/uL (ref 0.0–0.1)
Basophils Relative: 1 %
Eosinophils Absolute: 0 10*3/uL (ref 0.0–0.5)
Eosinophils Relative: 0 %
HCT: 42.2 % (ref 39.0–52.0)
Hemoglobin: 13.8 g/dL (ref 13.0–17.0)
Lymphocytes Relative: 24 %
Lymphs Abs: 3.4 10*3/uL (ref 0.7–4.0)
MCH: 22.9 pg — ABNORMAL LOW (ref 26.0–34.0)
MCHC: 32.7 g/dL (ref 30.0–36.0)
MCV: 70.1 fL — ABNORMAL LOW (ref 80.0–100.0)
Metamyelocytes Relative: 4 %
Monocytes Absolute: 0.7 10*3/uL (ref 0.1–1.0)
Monocytes Relative: 5 %
Myelocytes: 8 %
Neutro Abs: 8.2 10*3/uL — ABNORMAL HIGH (ref 1.7–7.7)
Neutrophils Relative %: 58 %
Platelets: 199 10*3/uL (ref 150–400)
RBC: 6.02 MIL/uL — ABNORMAL HIGH (ref 4.22–5.81)
RDW: 15.5 % (ref 11.5–15.5)
WBC: 14.2 10*3/uL — ABNORMAL HIGH (ref 4.0–10.5)
nRBC: 1 % — ABNORMAL HIGH (ref 0.0–0.2)
nRBC: 3 /100 WBC — ABNORMAL HIGH

## 2021-09-23 LAB — COMPREHENSIVE METABOLIC PANEL
ALT: 60 U/L — ABNORMAL HIGH (ref 0–44)
AST: 39 U/L (ref 15–41)
Albumin: 3.6 g/dL (ref 3.5–5.0)
Alkaline Phosphatase: 129 U/L — ABNORMAL HIGH (ref 38–126)
Anion gap: 15 (ref 5–15)
BUN: 10 mg/dL (ref 6–20)
CO2: 22 mmol/L (ref 22–32)
Calcium: 9.3 mg/dL (ref 8.9–10.3)
Chloride: 94 mmol/L — ABNORMAL LOW (ref 98–111)
Creatinine, Ser: 1.09 mg/dL (ref 0.61–1.24)
GFR, Estimated: >60 mL/min (ref >60)
Glucose, Bld: 539 mg/dL (ref 70–99)
Potassium: 4 mmol/L (ref 3.5–5.1)
Sodium: 131 mmol/L — ABNORMAL LOW (ref 135–145)
Total Bilirubin: 2 mg/dL — ABNORMAL HIGH (ref 0.3–1.2)
Total Protein: 7.3 g/dL (ref 6.5–8.1)

## 2021-09-23 LAB — TROPONIN I (HIGH SENSITIVITY)
Troponin I (High Sensitivity): 22 ng/L — ABNORMAL HIGH (ref <18)
Troponin I (High Sensitivity): 38 ng/L — ABNORMAL HIGH (ref <18)
Troponin I (High Sensitivity): 56 ng/L — ABNORMAL HIGH (ref <18)

## 2021-09-23 LAB — RETICULOCYTES
Immature Retic Fract: 48.9 % — ABNORMAL HIGH (ref 2.3–15.9)
RBC.: 5.98 MIL/uL — ABNORMAL HIGH (ref 4.22–5.81)
Retic Count, Absolute: 204.5 10*3/uL — ABNORMAL HIGH (ref 19.0–186.0)
Retic Ct Pct: 3.4 % — ABNORMAL HIGH (ref 0.4–3.1)

## 2021-09-23 LAB — RESP PANEL BY RT-PCR (FLU A&B, COVID) ARPGX2
Influenza A by PCR: NEGATIVE
Influenza B by PCR: NEGATIVE
SARS Coronavirus 2 by RT PCR: NEGATIVE

## 2021-09-23 LAB — CBG MONITORING, ED
Glucose-Capillary: 403 mg/dL — ABNORMAL HIGH (ref 70–99)
Glucose-Capillary: 342 mg/dL — ABNORMAL HIGH (ref 70–99)

## 2021-09-23 MED ORDER — HYDROMORPHONE HCL 1 MG/ML IJ SOLN
1.0000 mg | INTRAMUSCULAR | Status: DC | PRN
  Administered 2021-09-24: 1 mg via INTRAVENOUS
  Filled 2021-09-23: qty 1

## 2021-09-23 MED ORDER — INSULIN ASPART 100 UNIT/ML IJ SOLN
0.0000 [IU] | Freq: Three times a day (TID) | INTRAMUSCULAR | Status: DC
  Administered 2021-09-24 (×2): 11 [IU] via SUBCUTANEOUS
  Administered 2021-09-24: 20 [IU] via SUBCUTANEOUS
  Administered 2021-09-25 (×2): 11 [IU] via SUBCUTANEOUS

## 2021-09-23 MED ORDER — INSULIN GLARGINE-YFGN 100 UNIT/ML ~~LOC~~ SOLN
40.0000 [IU] | Freq: Every day | SUBCUTANEOUS | Status: DC
  Administered 2021-09-23 – 2021-09-24 (×2): 40 [IU] via SUBCUTANEOUS
  Filled 2021-09-23 (×4): qty 0.4

## 2021-09-23 MED ORDER — HYDROMORPHONE HCL 1 MG/ML IJ SOLN
1.0000 mg | Freq: Once | INTRAMUSCULAR | Status: AC
  Administered 2021-09-23: 1 mg via INTRAVENOUS
  Filled 2021-09-23: qty 1

## 2021-09-23 MED ORDER — IOHEXOL 350 MG/ML SOLN
100.0000 mL | Freq: Once | INTRAVENOUS | Status: AC | PRN
  Administered 2021-09-23: 100 mL via INTRAVENOUS

## 2021-09-23 MED ORDER — SODIUM CHLORIDE 0.9 % IV BOLUS
1000.0000 mL | Freq: Once | INTRAVENOUS | Status: AC
  Administered 2021-09-23: 1000 mL via INTRAVENOUS

## 2021-09-23 MED ORDER — ACETAMINOPHEN 325 MG PO TABS
650.0000 mg | ORAL_TABLET | Freq: Four times a day (QID) | ORAL | Status: DC | PRN
  Administered 2021-09-24: 650 mg via ORAL
  Filled 2021-09-23: qty 2

## 2021-09-23 MED ORDER — HYDROMORPHONE HCL 1 MG/ML IJ SOLN
2.0000 mg | INTRAMUSCULAR | Status: AC
  Administered 2021-09-23: 2 mg via INTRAVENOUS
  Filled 2021-09-23: qty 2

## 2021-09-23 MED ORDER — POLYETHYLENE GLYCOL 3350 17 G PO PACK: 17.0000 g | PACK | Freq: Every day | ORAL | Status: DC | PRN

## 2021-09-23 MED ORDER — MORPHINE SULFATE (PF) 4 MG/ML IV SOLN
4.0000 mg | Freq: Once | INTRAVENOUS | Status: AC
  Administered 2021-09-23: 4 mg via INTRAVENOUS
  Filled 2021-09-23: qty 1

## 2021-09-23 MED ORDER — OXYCODONE HCL 5 MG PO TABS
10.0000 mg | ORAL_TABLET | Freq: Four times a day (QID) | ORAL | Status: DC | PRN
  Administered 2021-09-24: 10 mg via ORAL
  Filled 2021-09-23: qty 2

## 2021-09-23 MED ORDER — SODIUM CHLORIDE 0.9 % IV SOLN: INTRAVENOUS | Status: AC

## 2021-09-23 MED ORDER — INSULIN ASPART 100 UNIT/ML IJ SOLN
10.0000 [IU] | Freq: Once | INTRAMUSCULAR | Status: AC
  Administered 2021-09-23: 10 [IU] via INTRAVENOUS

## 2021-09-23 MED ORDER — ACETAMINOPHEN 650 MG RE SUPP: 650.0000 mg | Freq: Four times a day (QID) | RECTAL | Status: DC | PRN

## 2021-09-23 MED ORDER — MORPHINE SULFATE (PF) 2 MG/ML IV SOLN
2.0000 mg | Freq: Once | INTRAVENOUS | Status: AC
  Administered 2021-09-23: 2 mg via INTRAVENOUS
  Filled 2021-09-23: qty 1

## 2021-09-23 MED ORDER — ENOXAPARIN SODIUM 40 MG/0.4ML IJ SOSY
40.0000 mg | PREFILLED_SYRINGE | INTRAMUSCULAR | Status: DC
  Administered 2021-09-24 – 2021-09-26 (×3): 40 mg via SUBCUTANEOUS
  Filled 2021-09-23 (×3): qty 0.4

## 2021-09-23 NOTE — ED Provider Notes (Signed)
Emergency Medicine Provider Triage Evaluation Note  Ricky Cantrell , a 25 y.o. male  was evaluated in triage.  Pt complains of sickle cell pain crisis with chest pain beginning after arrival here.  Review of Systems  Positive: Chest pain, diffuse body pain Negative: fever  Physical Exam  BP (!) 148/89 (BP Location: Left Arm)   Pulse 92   Temp 97.6 F (36.4 C) (Oral)   Resp (!) 22   SpO2 100%  Gen:   Awake, moaning and appears distressed Resp:  Normal effort  MSK:   Moves extremities without difficulty    Medical Decision Making  Medically screening exam initiated at 3:21 PM.  Appropriate orders placed.  Ricky Cantrell was informed that the remainder of the evaluation will be completed by another provider, this initial triage assessment does not replace that evaluation, and the importance of remaining in the ED until their evaluation is complete. EKG with diffuse ST elevation without any reciprocity.  Patient with prior ST elevation although somewhat more pronounced today.  Clinically this does not appear to be an ST elevation MI that is more consistent with his sickle cell pain.  However, will obtain cardiac labs. EKG reviewed.ED ECG REPORT   Date: 09/23/2021  Rate: 89  Rhythm: normal sinus rhythm  QRS Axis: normal  Intervals: normal  ST/T Wave abnormalities: ST elevations diffusely  Conduction Disutrbances:none  Narrative Interpretation:   Old EKG Reviewed: changes noted  I have personally reviewed the EKG tracing and agree with the computerized printout as noted.     Margarita Grizzle, MD 09/23/21 [phone]

## 2021-09-23 NOTE — ED Triage Notes (Signed)
Pt presents for sickle cell pain crisis.  Full body pain, chest pain.  States this is the worst crisis he has ever had.  Pt states this began yesterday.

## 2021-09-23 NOTE — H&P (Addendum)
Date: 09/23/2021               Patient Name:  Ricky Cantrell MRN: [id number]  DOB: [birth year] Age / Sex: 25 y.o., male   PCP: Pcp, No         Medical Service: Internal Medicine Teaching Service         Attending Physician: Dr. Sid Falcon, MD    First Contact: Dr. Ileene Musa Pager: [phone]  Second Contact: Dr. Coy Saunas Pager: [phone]       After Hours (After 5p/  First Contact Pager: [phone]  weekends / holidays): Second Contact Pager: [phone]   Chief Complaint: acute painful episode  History of Present Illness: Ricky Cantrell is a 25 y.o. male with a PMHx of T2DM and sickle cell disease who presents here today with diffuse body pain.  History is somewhat limited in the setting of patient being in severe pain.  Mr. Steinwand states that his diffuse body aches started about 2 days ago.  He was having severe back pain, he now has severe pain in his shoulders, elbows, and back.  He states that this is the worst painful episode that he has had.  His last painful episode requiring hospitalization was in 2018.  Normally, when he has painful episodes at home, he is able to take pain medication, and his pain will subside.  Initially in the ED, his pain was 10/10.  He has received some pain medications here, and now his pain is 8/10.  He states that he is on NovoLog and Toujeo at home for his diabetes.  He has been compliant with this medication. He thinks he has been urinating somewhat more frequently.  He does not believe he has been dehydrated recently and is unsure what brings on his pain episodes.  While in the ED, he also had new onset chest pain.  He describes it as a sharp pain that goes to his back and his shoulders.  He does not have pain with inspiration but does have pain with expiration.  He was given copious amounts of water while in the room, and he states that this alleviated his chest pain somewhat.  He believes he may be having fever/chills. Denies abdominal pain.  There are no other  complaints or concerns at this time.  Meds:  Current Meds  Medication Sig   ibuprofen (ADVIL) 800 MG tablet Take 800 mg by mouth every 8 (eight) hours as needed for headache or moderate pain.   insulin aspart (NOVOLOG FLEXPEN) 100 UNIT/ML FlexPen Inject 0-50 Units into the skin 3 (three) times daily with meals.   oxyCODONE (OXY IR/ROXICODONE) 5 MG immediate release tablet Take 5-10 mg by mouth every 6 (six) hours as needed for moderate pain.   TOUJEO MAX SOLOSTAR 300 UNIT/ML Solostar Pen Inject 60 Units into the skin daily.    Allergies: Allergies as of 09/23/2021   (No Known Allergies)   Past Medical History:  Diagnosis Date   Sickle cell anemia ([place])     Family History: Mother has a history of MI and diabetes.  Grandmother with diabetes.  Patient states that "his family members have the sickle trait."  Social History: Patient is a Ship broker at [employer].  He lives in an apartment.  He denies tobacco, alcohol, other drug use.  Review of Systems: A complete ROS was negative except as per HPI.   Physical Exam:  Blood pressure 116/77, pulse (!) 111, temperature 97.6 F (36.4 C), temperature source Oral, resp.  rate 20, height 6' (1.829 m), weight (!) 138.8 kg, SpO2 97 %. Physical Exam Constitutional:      Appearance: He is ill-appearing.     Comments: Somewhat limited in the setting of severe pain  HENT:     Head: Normocephalic and atraumatic.     Mouth/Throat:     Mouth: Mucous membranes are dry.  Eyes:     Extraocular Movements: Extraocular movements intact.     Pupils: Pupils are equal, round, and reactive to light.  Cardiovascular:     Rate and Rhythm: Regular rhythm. Tachycardia present.     Heart sounds: No murmur heard.   No friction rub. No gallop.  Pulmonary:     Effort: Pulmonary effort is normal.     Comments: Transmitted upper airway sounds Abdominal:     General: Abdomen is flat. There is no distension.     Palpations: Abdomen is soft.     Tenderness: There is no  abdominal tenderness.  Musculoskeletal:        General: No swelling or deformity.  Skin:    General: Skin is warm and dry.  Neurological:     General: No focal deficit present.     Mental Status: He is alert and oriented to person, place, and time.  Psychiatric:        Mood and Affect: Mood normal.        Behavior: Behavior normal.    EKG: personally reviewed my interpretation is diffuse ST elevations  CXR: personally reviewed my interpretation is mild cardiomegaly and low lung volumes  CT Angio Chest/Abd/Pelvis: IMPRESSION: 1. Normal contour and caliber of the thoracic and abdominal aorta. No evidence of aneurysm, dissection, or other acute aortic pathology. No significant atherosclerosis. 2. Enlargement of the main pulmonary artery, as can be seen in pulmonary hypertension. 3. Hepatic steatosis. 4. Splenomegaly, maximum coronal span 15.9 cm. 5. Cholelithiasis without evidence of acute cholecystitis. 6. 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   Assessment & Plan by Problem: Principal Problem:   Sickle cell crisis (HCC)  #Acute painful episode, sickle cell disease The patient presents here today with the worst painful episode he has had.  On arrival, patient was tachycardic but otherwise hemodynamically stable. He has been given pain meds and fluids with a slight improvement of his pain from 10/10 to 8/10.  Sugar is also elevated to the 500s here initially, but no evidence of DKA noted on labs.  Hemoglobin 18.8, MCV 70, reticulocyte count of 204. No evidence of splenic rupture on CT. CXR findings not concerning for acute chest syndrome.   While in the room, patient was requesting more IV fluids and water. He drank multiple cups of water in the room and appeared clinically dehydrated.  Likely that this painful episode occurred as a result of dehydration with elevated blood sugars.  -IV Dilaudid 1 mg  q2h PRN and oxycodone 10 mg po q6h  -IV NS @ 125 mL/hr -Telemetry -Follow-up AM CBC and CMP  #Substernal chest pain While in the ED, patient had new onset of stabbing chest pain radiating to the back and shoulders.  Patient is tachycardic but otherwise hemodynamically stable at this time.  CT angio of chest and abdomen does not show evidence of acute dissection. Troponins mildly elevated, 22->38. EKG showed diffuse ST elevations, more consistent with pericarditis. Cardiology was consulted and will evaluate the patient given his elevated troponins with diffuse ST elevations.   -  Cardiology is on board, appreciate their recs  #T2DM Most recent A1c of 8.1 in January 2022. Patient takes Toujeo 60 U nightly and 20 units of novolog TID at home. The patient had blood sugars elevated to the 500s on admit, and these have improved to 340 most recently.  -Glargine 40 units nightly with SSI resistant -CBG four times daily  Dispo: Admit patient to Inpatient with expected length of stay greater than 2 midnights.  Signed: Andrey Campanile, MD 09/23/2021, 10:10 PM  Pager: [phone]  After 5pm on weekdays and 1pm on weekends: On Call pager: [phone]

## 2021-09-23 NOTE — ED Provider Notes (Signed)
[facility] EMERGENCY DEPARTMENT Provider Note   CSN: [id number] Arrival date & time: 09/23/21  1403     History Chief Complaint  Patient presents with  . Sickle Cell Pain Crisis    Ricky Cantrell is a 25 y.o. male.  HPI  25 year old male history of [place] disease, type 2 diabetes presents today complaining of diffuse body pain that started earlier today.  He states that he got to the hospital began having substernal chest pain.  Is consistent with the rest of his body pain.  It is all about a 10 out of 10.    Past Medical History:  Diagnosis Date  . Sickle cell anemia (HCC)     There are no problems to display for this patient.   History reviewed. No pertinent surgical history.     History reviewed. No pertinent family history.  Social History   Tobacco Use  . Smoking status: Never  . Smokeless tobacco: Never  Substance Use Topics  . Alcohol use: No  . Drug use: No    Home Medications Prior to Admission medications   Medication Sig Start Date End Date Taking? Authorizing Provider  ibuprofen (ADVIL) 800 MG tablet Take 800 mg by mouth every 8 (eight) hours as needed for headache or moderate pain. 06/05/21  Yes [provider]  insulin aspart (NOVOLOG FLEXPEN) 100 UNIT/ML FlexPen Inject 0-50 Units into the skin 3 (three) times daily with meals.   Yes [provider]  oxyCODONE (OXY IR/ROXICODONE) 5 MG immediate release tablet Take 5-10 mg by mouth every 6 (six) hours as needed for moderate pain. 06/05/21  Yes [provider]  TOUJEO MAX SOLOSTAR 300 UNIT/ML Solostar Pen Inject 60 Units into the skin daily. 04/13/21  Yes [provider]    Allergies    Patient has no known allergies.  Review of Systems   Review of Systems  All other systems reviewed and are negative.  Physical Exam Updated Vital Signs BP 140/86   Pulse (!) 104   Temp 97.6 F (36.4 C) (Oral)   Resp 19   SpO2 95%   Physical Exam Vitals and  nursing note reviewed.  Constitutional:      General: He is in acute distress.     Appearance: Normal appearance. He is obese.  HENT:     Head: Normocephalic.     Right Ear: External ear normal.     Left Ear: External ear normal.     Nose: Nose normal.     Mouth/Throat:     Pharynx: Oropharynx is clear.  Eyes:     Pupils: Pupils are equal, round, and reactive to light.  Cardiovascular:     Rate and Rhythm: Normal rate and regular rhythm.     Pulses: Normal pulses.  Pulmonary:     Effort: Pulmonary effort is normal.  Abdominal:     General: Abdomen is flat.  Musculoskeletal:        General: Normal range of motion.     Cervical back: Normal range of motion.  Skin:    General: Skin is warm.  Neurological:     General: No focal deficit present.     Mental Status: He is alert.  Psychiatric:        Mood and Affect: Mood normal.    ED Results / Procedures / Treatments   Labs (all labs ordered are listed, but only abnormal results are displayed) Labs Reviewed  COMPREHENSIVE METABOLIC PANEL - Abnormal; Notable for the following  components:      Result Value   Sodium 131 (*)    Chloride 94 (*)    Glucose, Bld 539 (*)    ALT 60 (*)    Alkaline Phosphatase 129 (*)    Total Bilirubin 2.0 (*)    All other components within normal limits  CBC WITH DIFFERENTIAL/PLATELET - Abnormal; Notable for the following components:   WBC 14.2 (*)    RBC 6.02 (*)    MCV 70.1 (*)    MCH 22.9 (*)    nRBC 1.0 (*)    Neutro Abs 8.2 (*)    nRBC 3 (*)    Abs Immature Granulocytes 1.70 (*)    All other components within normal limits  RETICULOCYTES - Abnormal; Notable for the following components:   Retic Ct Pct 3.4 (*)    RBC. 5.98 (*)    Retic Count, Absolute 204.5 (*)    Immature Retic Fract 48.9 (*)    All other components within normal limits  CBG MONITORING, ED - Abnormal; Notable for the following components:   Glucose-Capillary 403 (*)    All other components within normal limits   TROPONIN I (HIGH SENSITIVITY) - Abnormal; Notable for the following components:   Troponin I (High Sensitivity) 22 (*)    All other components within normal limits  TROPONIN I (HIGH SENSITIVITY) - Abnormal; Notable for the following components:   Troponin I (High Sensitivity) 38 (*)    All other components within normal limits    EKG EKG Interpretation  Date/Time:  Saturday September 23 2021 15:14:34 EST Ventricular Rate:  89 PR Interval:  176 QRS Duration: 100 QT Interval:  362 QTC Calculation: 440 R Axis:   21 Text Interpretation: Normal sinus rhythm  diffuse st elevation Abnormal ECG Confirmed by Margarita Grizzle [phone]) on 09/23/2021 3:19:08 PM  Radiology DG Chest Port 1 View  Result Date: 09/23/2021 CLINICAL DATA:  Chest pain EXAM: PORTABLE CHEST 1 VIEW COMPARISON:  Chest x-Arletha Marschke 04/26/2017 FINDINGS: Heart appears mildly enlarged. Mediastinum is stable. Low lung volumes with crowding of the vasculature. No focal consolidation identified. No pleural effusion or pneumothorax visualized. IMPRESSION: Mild cardiomegaly and low lung volumes. Electronically Signed   By: Jannifer Hick M.D.   On: 09/23/2021 16:36   CT Angio Chest/Abd/Pel for Dissection W and/or Wo Contrast  Result Date: 09/23/2021 CLINICAL DATA:  Sickle cell pain crisis, chest pain, abdominal pain, aortic dissection suspected EXAM: CT ANGIOGRAPHY CHEST, ABDOMEN AND PELVIS TECHNIQUE: Non-contrast CT of the chest was initially obtained. Multidetector CT imaging through the chest, abdomen and pelvis was performed using the standard protocol during bolus administration of intravenous contrast. Multiplanar reconstructed images and MIPs were obtained and reviewed to evaluate the vascular anatomy. CONTRAST:  OMNIPAQUE IOHEXOL 350 MG/ML SOLN COMPARISON:  04/26/2017 FINDINGS: CTA CHEST FINDINGS Cardiovascular: Preferential opacification of the thoracic aorta. Normal contour and caliber of the thoracic aorta. No evidence of  aneurysm, dissection, or other acute aortic pathology. No significant atherosclerosis. Normal heart size. No pericardial effusion. Enlargement of the main pulmonary artery, measuring up to 3.7 cm in caliber. Mediastinum/Nodes: No enlarged mediastinal, hilar, or axillary lymph nodes. Thymic remnant in the anterior mediastinum. Thyroid gland, trachea, and esophagus demonstrate no significant findings. Lungs/Pleura: Lungs are clear. No pleural effusion or pneumothorax. Musculoskeletal: No chest wall abnormality. 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 Review of the MIP images confirms the above findings. CTA ABDOMEN AND PELVIS FINDINGS VASCULAR Normal contour and caliber of the abdominal aorta. No evidence  of aneurysm, dissection, or other acute aortic pathology. Standard branching pattern of the abdominal aorta with solitary bilateral renal arteries. Review of the MIP images confirms the above findings. NON-VASCULAR Hepatobiliary: No solid liver abnormality is seen. Hepatic steatosis. Multiple small gallstones in the dependent gallbladder. Gallbladder wall thickening, or biliary dilatation. Pancreas: Unremarkable. No pancreatic ductal dilatation or surrounding inflammatory changes. Spleen: Splenomegaly, maximum coronal span 15.9 cm. Adrenals/Urinary Tract: Adrenal glands are unremarkable. Kidneys are normal, without renal calculi, solid lesion, or hydronephrosis. Bladder is unremarkable. Stomach/Bowel: Stomach is within normal limits. Appendix appears normal. No evidence of bowel wall thickening, distention, or inflammatory changes. Lymphatic: No enlarged abdominal or pelvic lymph nodes. Reproductive: No mass or other significant abnormality. Other: No abdominal wall hernia or abnormality. No abdominopelvic ascites. Musculoskeletal: Avascular necrosis of the bilateral femoral heads. Review of the MIP images confirms the above findings. IMPRESSION: 1. Normal contour and caliber of the thoracic and abdominal  aorta. No evidence of aneurysm, dissection, or other acute aortic pathology. No significant atherosclerosis. 2. Enlargement of the main pulmonary artery, as can be seen in pulmonary hypertension. 3. Hepatic steatosis. 4. Splenomegaly, maximum coronal span 15.9 cm. 5. Cholelithiasis without evidence of acute cholecystitis. 6. 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 Electronically Signed   By: Delanna Ahmadi M.D.   On: 09/23/2021 19:36    Procedures .Critical Care Performed by: Pattricia Boss, MD Authorized by: Pattricia Boss, MD   Critical care provider statement:    Critical care time (minutes):  50   Critical care was time spent personally by me on the following activities:  Development of treatment plan with patient or surrogate, discussions with consultants, evaluation of patient's response to treatment, examination of patient, ordering and review of laboratory studies, ordering and review of radiographic studies, ordering and performing treatments and interventions, pulse oximetry, re-evaluation of patient's condition and review of old charts   Medications Ordered in ED Medications  insulin aspart (novoLOG) injection 10 Units (has no administration in time range)  HYDROmorphone (DILAUDID) injection 2 mg (has no administration in time range)  morphine 2 MG/ML injection 2 mg (2 mg Intravenous Given 09/23/21 1547)  morphine 4 MG/ML injection 4 mg (4 mg Intravenous Given 09/23/21 1625)  sodium chloride 0.9 % bolus 1,000 mL (0 mLs Intravenous Stopped 09/23/21 2041)  HYDROmorphone (DILAUDID) injection 1 mg (1 mg Intravenous Given 09/23/21 1746)  iohexol (OMNIPAQUE) 350 MG/ML injection 100 mL (100 mLs Intravenous Contrast Given 09/23/21 1857)  HYDROmorphone (DILAUDID) injection 2 mg (2 mg Intravenous Given 09/23/21 2043)    ED Course  I have reviewed the triage vital signs and the nursing notes.  Pertinent labs &  imaging results that were available during my care of the patient were reviewed by me and considered in my medical decision making (see chart for details).  Clinical Course as of 09/23/21 2049  Sat Sep 23, 2021  1939 Troponin I (High Sensitivity)(!): 38 [DR]    Clinical Course User Index [DR] Pattricia Boss, MD   MDM Rules/Calculators/A&P                          25 year old male with [place] disease presents today with severe diffuse body pain as well as chest pain has begun here in the department and radiates through to his back.  EKG is significant for some diffuse ST elevation without reciprocal changes consistent more with pericarditis.  However  patient is complaining of severe pain that is different in his chest from the rest of his body.  He has a known history of diabetes and his blood sugars are elevated here initially at 500.  He has received a liter of normal saline.  He has now had 10 units of insulin ordered.  Patient with hyperglycemia but no evidence of DKA noted on labs CT angio of chest and abdomen does not show any acute dissection or other abnormalities of the aorta.  He does have cholelithiasis without any evidence of acute cholecystitis, bilateral avascular necrosis, and splenomegaly consistent with his [place] disease.  Due to his severity of his chest pain and elevated troponins, cardiology was consulted and will see him.  Plan consult to hospitalist for admission for ongoing pain control, control of his blood sugars, and further evaluation of his pain source. Discussed with internal medicine teaching service who will see for admission. Final Clinical Impression(s) / ED Diagnoses Final diagnoses:  Sickle cell pain crisis ([place])  Chest pain, unspecified type  Hyperglycemia    Rx / DC Orders ED Discharge Orders     None        Pattricia Boss, MD 09/23/21 2049

## 2021-09-24 DIAGNOSIS — D5700 Hb-SS disease with crisis, unspecified: Secondary | ICD-10-CM | POA: Diagnosis not present

## 2021-09-24 DIAGNOSIS — R7989 Other specified abnormal findings of blood chemistry: Secondary | ICD-10-CM

## 2021-09-24 DIAGNOSIS — D57819 Other sickle-cell disorders with crisis, unspecified: Secondary | ICD-10-CM

## 2021-09-24 LAB — URINALYSIS, ROUTINE W REFLEX MICROSCOPIC
Bacteria, UA: NONE SEEN
Bilirubin Urine: NEGATIVE
Glucose, UA: >=500 mg/dL — AB
Hgb urine dipstick: NEGATIVE
Ketones, ur: NEGATIVE mg/dL
Leukocytes,Ua: NEGATIVE
Nitrite: NEGATIVE
Protein, ur: NEGATIVE mg/dL
Specific Gravity, Urine: 1.013 (ref 1.005–1.030)
pH: 5 (ref 5.0–8.0)

## 2021-09-24 LAB — COMPREHENSIVE METABOLIC PANEL
ALT: 65 U/L — ABNORMAL HIGH (ref 0–44)
AST: 92 U/L — ABNORMAL HIGH (ref 15–41)
Albumin: 3.7 g/dL (ref 3.5–5.0)
Alkaline Phosphatase: 302 U/L — ABNORMAL HIGH (ref 38–126)
Anion gap: 11 (ref 5–15)
BUN: 8 mg/dL (ref 6–20)
CO2: 22 mmol/L (ref 22–32)
Calcium: 8.4 mg/dL — ABNORMAL LOW (ref 8.9–10.3)
Chloride: 95 mmol/L — ABNORMAL LOW (ref 98–111)
Creatinine, Ser: 0.94 mg/dL (ref 0.61–1.24)
GFR, Estimated: >60 mL/min (ref >60)
Glucose, Bld: 377 mg/dL — ABNORMAL HIGH (ref 70–99)
Potassium: 4.6 mmol/L (ref 3.5–5.1)
Sodium: 128 mmol/L — ABNORMAL LOW (ref 135–145)
Total Bilirubin: 2.6 mg/dL — ABNORMAL HIGH (ref 0.3–1.2)
Total Protein: 7.4 g/dL (ref 6.5–8.1)

## 2021-09-24 LAB — CBC
HCT: 40.7 % (ref 39.0–52.0)
Hemoglobin: 13.5 g/dL (ref 13.0–17.0)
MCH: 23.2 pg — ABNORMAL LOW (ref 26.0–34.0)
MCHC: 33.2 g/dL (ref 30.0–36.0)
MCV: 69.9 fL — ABNORMAL LOW (ref 80.0–100.0)
Platelets: 130 10*3/uL — ABNORMAL LOW (ref 150–400)
RBC: 5.82 MIL/uL — ABNORMAL HIGH (ref 4.22–5.81)
RDW: 15 % (ref 11.5–15.5)
WBC: 20.9 10*3/uL — ABNORMAL HIGH (ref 4.0–10.5)
nRBC: 2.3 % — ABNORMAL HIGH (ref 0.0–0.2)

## 2021-09-24 LAB — CBG MONITORING, ED
Glucose-Capillary: 386 mg/dL — ABNORMAL HIGH (ref 70–99)
Glucose-Capillary: 281 mg/dL — ABNORMAL HIGH (ref 70–99)

## 2021-09-24 LAB — C-REACTIVE PROTEIN: CRP: 1.1 mg/dL — ABNORMAL HIGH (ref <1.0)

## 2021-09-24 LAB — TROPONIN I (HIGH SENSITIVITY): Troponin I (High Sensitivity): 45 ng/L — ABNORMAL HIGH (ref <18)

## 2021-09-24 LAB — GLUCOSE, CAPILLARY
Glucose-Capillary: 256 mg/dL — ABNORMAL HIGH (ref 70–99)
Glucose-Capillary: 252 mg/dL — ABNORMAL HIGH (ref 70–99)

## 2021-09-24 LAB — HIV ANTIBODY (ROUTINE TESTING W REFLEX): HIV Screen 4th Generation wRfx: NONREACTIVE

## 2021-09-24 LAB — CK: Total CK: 137 U/L (ref 49–397)

## 2021-09-24 LAB — SEDIMENTATION RATE: Sed Rate: 2 mm/hr (ref 0–16)

## 2021-09-24 MED ORDER — IBUPROFEN 200 MG PO TABS
600.0000 mg | ORAL_TABLET | Freq: Three times a day (TID) | ORAL | Status: DC
  Administered 2021-09-24 (×2): 600 mg via ORAL
  Filled 2021-09-24 (×3): qty 3

## 2021-09-24 MED ORDER — SODIUM CHLORIDE 0.9% FLUSH: 9.0000 mL | INTRAVENOUS | Status: DC | PRN

## 2021-09-24 MED ORDER — KETOROLAC TROMETHAMINE 15 MG/ML IJ SOLN
15.0000 mg | Freq: Once | INTRAMUSCULAR | Status: AC
  Administered 2021-09-24: 15 mg via INTRAVENOUS
  Filled 2021-09-24: qty 1

## 2021-09-24 MED ORDER — DIPHENHYDRAMINE HCL 25 MG PO CAPS: 25.0000 mg | ORAL_CAPSULE | ORAL | Status: DC | PRN

## 2021-09-24 MED ORDER — OXYCODONE HCL 5 MG PO TABS
10.0000 mg | ORAL_TABLET | Freq: Four times a day (QID) | ORAL | Status: DC
  Administered 2021-09-24 – 2021-09-26 (×6): 10 mg via ORAL
  Filled 2021-09-24 (×8): qty 2

## 2021-09-24 MED ORDER — SODIUM CHLORIDE 0.9 % IV SOLN
2.0000 g | INTRAVENOUS | Status: DC
  Administered 2021-09-24 – 2021-09-27 (×4): 2 g via INTRAVENOUS
  Filled 2021-09-24 (×4): qty 20

## 2021-09-24 MED ORDER — COLCHICINE 0.6 MG PO TABS
0.6000 mg | ORAL_TABLET | Freq: Every day | ORAL | Status: DC
  Administered 2021-09-24 – 2021-09-27 (×4): 0.6 mg via ORAL
  Filled 2021-09-24 (×5): qty 1

## 2021-09-24 MED ORDER — NALOXONE HCL 0.4 MG/ML IJ SOLN: 0.4000 mg | INTRAMUSCULAR | Status: DC | PRN

## 2021-09-24 MED ORDER — SODIUM CHLORIDE 0.9 % IV SOLN
25.0000 mg | INTRAVENOUS | Status: DC | PRN
  Filled 2021-09-24 (×4): qty 0.5

## 2021-09-24 MED ORDER — SODIUM CHLORIDE 0.9 % IV SOLN: INTRAVENOUS | Status: AC

## 2021-09-24 MED ORDER — ONDANSETRON HCL 4 MG/2ML IJ SOLN: 4.0000 mg | Freq: Four times a day (QID) | INTRAMUSCULAR | Status: DC | PRN

## 2021-09-24 MED ORDER — HYDROMORPHONE 1 MG/ML IV SOLN
INTRAVENOUS | Status: DC
  Administered 2021-09-25: 2.5 mg via INTRAVENOUS
  Administered 2021-09-25: 1 mg via INTRAVENOUS
  Administered 2021-09-25: 5.95 mg via INTRAVENOUS
  Administered 2021-09-26: 4 mg via INTRAVENOUS
  Administered 2021-09-26: 6 mg via INTRAVENOUS
  Administered 2021-09-26: 2.5 mg via INTRAVENOUS
  Filled 2021-09-24 (×2): qty 30

## 2021-09-24 MED ORDER — HYDROMORPHONE HCL 1 MG/ML IJ SOLN
1.0000 mg | INTRAMUSCULAR | Status: DC | PRN
  Administered 2021-09-24 (×2): 1 mg via INTRAVENOUS
  Filled 2021-09-24 (×2): qty 1

## 2021-09-24 NOTE — Consult Note (Signed)
Cardiology Consultation:   Patient ID: Ricky Cantrell MRN: [id number]; DOB: 03/13/1996  Admit date: 09/23/2021 Date of Consult: 09/24/2021  Primary Care Provider: Merryl Hacker No [facility] Cardiologist: None  [facility] Electrophysiologist:  None   Patient Profile:   Ricky Cantrell is a 25 y.o. male with SCD, DM2, and morbid obesity who is being seen today for the evaluation of chest pain at the request of Ricky Cantrell (ED).  History of Present Illness:   Ricky Cantrell has been experiencing diffuse myalgias for 2 days along with back, shoulder, elbow and chest pain.  His prior hospitalization for SCD crisis was in 2018 however he is normally able to manage at home.  He was not sure what brought on this episode however given no other precipitants I would guess that the recent acute weather changes probably exacerbated his symptoms. He was also found to be significantly hyperglycemic on admission. He has pain with expiration but not inspiration and his chest pain improved with IV fluids.  Past Medical History:  Diagnosis Date   Sickle cell anemia ([place])    History reviewed. No pertinent surgical history.   Home Medications:  Prior to Admission medications   Medication Sig Start Date End Date Taking? Authorizing Provider  ibuprofen (ADVIL) 800 MG tablet Take 800 mg by mouth every 8 (eight) hours as needed for headache or moderate pain. 06/05/21  Yes [provider]  insulin aspart (NOVOLOG FLEXPEN) 100 UNIT/ML FlexPen Inject 0-50 Units into the skin 3 (three) times daily with meals.   Yes [provider]  oxyCODONE (OXY IR/ROXICODONE) 5 MG immediate release tablet Take 5-10 mg by mouth every 6 (six) hours as needed for moderate pain. 06/05/21  Yes [provider]  TOUJEO MAX SOLOSTAR 300 UNIT/ML Solostar Pen Inject 60 Units into the skin daily. 04/13/21  Yes [provider]    Inpatient Medications: Scheduled Meds:  enoxaparin (LOVENOX) injection  40 mg  Subcutaneous Q24H   insulin aspart  0-20 Units Subcutaneous TID WC   insulin glargine-yfgn  40 Units Subcutaneous QHS   oxyCODONE  10 mg Oral Q6H   Continuous Infusions:  sodium chloride 125 mL/hr at 09/24/21 0006   PRN Meds: acetaminophen **OR** acetaminophen, HYDROmorphone (DILAUDID) injection, polyethylene glycol  Allergies:   No Known Allergies  Social History:   Social History   Socioeconomic History   Marital status: Single    Spouse name: Not on file   Number of children: Not on file   Years of education: Not on file   Highest education level: Not on file  Occupational History   Not on file  Tobacco Use   Smoking status: Never   Smokeless tobacco: Never  Substance and Sexual Activity   Alcohol use: No   Drug use: No   Sexual activity: Not on file  Other Topics Concern   Not on file  Social History Narrative   Not on file   Social Determinants of Health   Financial Resource Strain: Not on file  Food Insecurity: Not on file  Transportation Needs: Not on file  Physical Activity: Not on file  Stress: Not on file  Social Connections: Not on file  Intimate Partner Violence: Not on file    Family History:   History reviewed. No pertinent family history.   ROS:  Review of Systems: [y] = yes, [ ]  = no      General: Weight gain [ ] ; Weight loss [ ] ; Anorexia [ ] ; Fatigue [ ] ; Fever [ ] ;  Chills [ ] ; Weakness [ ]    Cardiac: Chest pain/pressure [y]; Resting SOB [ ] ; Exertional SOB [ ] ; Orthopnea [ ] ; Pedal Edema [ ] ; Palpitations [ ] ; Syncope [ ] ; Presyncope [ ] ; Paroxysmal nocturnal dyspnea [ ]    Pulmonary: Cough [ ] ; Wheezing [ ] ; Hemoptysis [ ] ; Sputum [ ] ; Snoring [ ]    GI: Vomiting [ ] ; Dysphagia [ ] ; Melena [ ] ; Hematochezia [ ] ; Heartburn [ ] ; Abdominal pain [ ] ; Constipation [ ] ; Diarrhea [ ] ; BRBPR [ ]    GU: Hematuria [ ] ; Dysuria [ ] ; Nocturia [ ]  Vascular: Pain in legs with walking [ ] ; Pain in feet with lying flat [ ] ; Non-healing sores [ ] ; Stroke [ ] ;  TIA [ ] ; Slurred speech [ ] ;   Neuro: Headaches [ ] ; Vertigo [ ] ; Seizures [ ] ; Paresthesias [ ] ;Blurred vision [ ] ; Diplopia [ ] ; Vision changes [ ]    Ortho/Skin: Arthritis [ ] ; Joint pain [y]; Muscle pain [ ] ; Joint swelling [ ] ; Back Pain [y]; Rash [ ]    Psych: Depression [ ] ; Anxiety [ ]    Heme: Bleeding problems [ ] ; Clotting disorders [ ] ; Anemia [ ]    Endocrine: Diabetes [ ] ; Thyroid dysfunction [ ]    Physical Exam/Data:   Vitals:   09/23/21 2345 09/24/21 0000 09/24/21 0013 09/24/21 0015  BP: (!) 149/81 (!) 149/88  138/90  Pulse: (!) 130 (!) 119  (!) 125  Resp: (!) 39 (!) 25  (!) 26  Temp:   100.2 F (37.9 C)   TempSrc:   Axillary   SpO2: 98% 97%  95%  Weight:      Height:        Intake/Output Summary (Last 24 hours) at 09/24/2021 0057 Last data filed at 09/23/2021 Feb 17, 2156 Gross per 24 hour  Intake 1000 ml  Output 900 ml  Net 100 ml   Last 3 Weights 09/23/2021 08/07/2017 04/26/2017  Weight (lbs) 306 lb 340 lb 335 lb  Weight (kg) 138.801 kg 154.223 kg 151.955 kg     Body mass index is 41.5 kg/m.  General:  Well nourished, well developed, in moderate pain HEENT: normal Lymph: no adenopathy Neck: no JVD Endocrine:  No thryomegaly Vascular: No carotid bruits; FA pulses 2+ bilaterally without bruits  Cardiac:  normal S1, S2; RRR; no murmur  Lungs:  clear to auscultation bilaterally, no wheezing, rhonchi or rales  Abd: soft, nontender, no hepatomegaly  Ext: no edema Musculoskeletal:  No deformities, BUE and BLE strength normal and equal Skin: warm and dry  Neuro:  CNs 2-12 intact, no focal abnormalities noted Psych:  Normal affect   EKG:  The EKG was personally reviewed and demonstrates: NSR, anterolateral and inferior ST elevation, more pronounced from prior in 02-16-17 Telemetry:  Telemetry was personally reviewed and demonstrates: NSR->sinus tach   Relevant CV Studies: none  Laboratory Data:  High Sensitivity Troponin:   Recent Labs  Lab 09/23/21 1522  09/23/21 1722 09/23/21 2200  TROPONINIHS 22* 38* 56*     Chemistry Recent Labs  Lab 09/23/21 1506  NA 131*  K 4.0  CL 94*  CO2 22  GLUCOSE 539*  BUN 10  CREATININE 1.09  CALCIUM 9.3  GFRNONAA >60  ANIONGAP 15    Recent Labs  Lab 09/23/21 1506  PROT 7.3  ALBUMIN 3.6  AST 39  ALT 60*  ALKPHOS 129*  BILITOT 2.0*   Hematology Recent Labs  Lab 09/23/21 1506  WBC 14.2*  RBC 6.02*  5.98*  HGB 13.8  HCT 42.2  MCV 70.1*  MCH 22.9*  MCHC 32.7  RDW 15.5  PLT 199   BNPNo results for input(s): BNP, PROBNP in the last 168 hours.  DDimer No results for input(s): DDIMER in the last 168 hours.  Radiology/Studies:  DG Chest Port 1 View  Result Date: 09/23/2021 CLINICAL DATA:  Chest pain EXAM: PORTABLE CHEST 1 VIEW COMPARISON:  Chest x-ray 04/26/2017 FINDINGS: Heart appears mildly enlarged. Mediastinum is stable. Low lung volumes with crowding of the vasculature. No focal consolidation identified. No pleural effusion or pneumothorax visualized. IMPRESSION: Mild cardiomegaly and low lung volumes. Electronically Signed   By: Jannifer Hick M.D.   On: 09/23/2021 16:36   CT Angio Chest/Abd/Pel for Dissection W and/or Wo Contrast  Result Date: 09/23/2021 CLINICAL DATA:  Sickle cell pain crisis, chest pain, abdominal pain, aortic dissection suspected EXAM: CT ANGIOGRAPHY CHEST, ABDOMEN AND PELVIS TECHNIQUE: Non-contrast CT of the chest was initially obtained. Multidetector CT imaging through the chest, abdomen and pelvis was performed using the standard protocol during bolus administration of intravenous contrast. Multiplanar reconstructed images and MIPs were obtained and reviewed to evaluate the vascular anatomy. CONTRAST:  OMNIPAQUE IOHEXOL 350 MG/ML SOLN COMPARISON:  04/26/2017 FINDINGS: CTA CHEST FINDINGS Cardiovascular: Preferential opacification of the thoracic aorta. Normal contour and caliber of the thoracic aorta. No evidence of aneurysm, dissection, or other acute  aortic pathology. No significant atherosclerosis. Normal heart size. No pericardial effusion. Enlargement of the main pulmonary artery, measuring up to 3.7 cm in caliber. Mediastinum/Nodes: No enlarged mediastinal, hilar, or axillary lymph nodes. Thymic remnant in the anterior mediastinum. Thyroid gland, trachea, and esophagus demonstrate no significant findings. Lungs/Pleura: Lungs are clear. No pleural effusion or pneumothorax. Musculoskeletal: No chest wall abnormality. 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 Review of the MIP images confirms the above findings. CTA ABDOMEN AND PELVIS FINDINGS VASCULAR Normal contour and caliber of the abdominal aorta. No evidence of aneurysm, dissection, or other acute aortic pathology. Standard branching pattern of the abdominal aorta with solitary bilateral renal arteries. Review of the MIP images confirms the above findings. NON-VASCULAR Hepatobiliary: No solid liver abnormality is seen. Hepatic steatosis. Multiple small gallstones in the dependent gallbladder. Gallbladder wall thickening, or biliary dilatation. Pancreas: Unremarkable. No pancreatic ductal dilatation or surrounding inflammatory changes. Spleen: Splenomegaly, maximum coronal span 15.9 cm. Adrenals/Urinary Tract: Adrenal glands are unremarkable. Kidneys are normal, without renal calculi, solid lesion, or hydronephrosis. Bladder is unremarkable. Stomach/Bowel: Stomach is within normal limits. Appendix appears normal. No evidence of bowel wall thickening, distention, or inflammatory changes. Lymphatic: No enlarged abdominal or pelvic lymph nodes. Reproductive: No mass or other significant abnormality. Other: No abdominal wall hernia or abnormality. No abdominopelvic ascites. Musculoskeletal: Avascular necrosis of the bilateral femoral heads. Review of the MIP images confirms the above findings. IMPRESSION: 1. Normal contour and caliber of the thoracic and abdominal aorta. No evidence of aneurysm,  dissection, or other acute aortic pathology. No significant atherosclerosis. 2. Enlargement of the main pulmonary artery, as can be seen in pulmonary hypertension. 3. Hepatic steatosis. 4. Splenomegaly, maximum coronal span 15.9 cm. 5. Cholelithiasis without evidence of acute cholecystitis. 6. 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 Electronically Signed   By: Jearld Lesch M.D.   On: 09/23/2021 19:36    HEART score (4), moderate risk 12-16.6% risk of MACE  Assessment and Plan:   Troponin elevation Sickle cell pain crisis  Mr. Greenhalgh presents with  diffuse arthralgias predominately affecting his hips, shoulders, and back which are similar to his prior sickle cell attacks although it is more severe this time.  He also was found to have a mild troponin elevation ([id number]) and more prominent ST elevations in the anterior lateral and inferior leads compared to 2018.  His chest pain is not his predominant symptom as he feels that the rest of his pains are fairly typical of his usual sickle cell pain crisis.  He did have a CT C/A/P for dissection evaluation which showed splenomegaly, cholelithiasis, hepatic steatosis, main PA enlargement, and avascular necrosis of the bilateral humeral and femoral heads.  Did not reveal any coronary calcifications which is not surprising given his young age.  Risk factors for premature CAD include obesity, and sickle cell disease.  Given that he has some cardiomegaly and PA enlargement on imaging I think is reasonable to get an echo before discharge.  Suspicion for primary ACS is very low given his presentation.  All of his areas of pain had improved with hydration, pain medication, and oxygen.    For questions or updates, please contact Joyce Please consult [email or web address] for contact info under   Signed, Dion Body, MD  09/24/2021 12:57 AM

## 2021-09-24 NOTE — Progress Notes (Addendum)
HD#1 Subjective:  Overnight Events: NAEO   Patient quiet on exam, answering most questions with one word answers as he is very uncomfortable. Pain is about an 8/10. Improves some with pain medications received so far but not enough. Denies any dysuria, rashes, or skin changes.  Objective:  Vital signs in last 24 hours: Vitals:   09/24/21 0500 09/24/21 0700 09/24/21 0715 09/24/21 1028  BP: (!) 146/66 (!) 143/68 138/71   Pulse: (!) 116 (!) 121 (!) 110   Resp: (!) 30 (!) 25 (!) 36 (!) 25  Temp:      TempSrc:      SpO2: 94% 96% 95% 97%  Weight:      Height:       Supplemental O2: Nasal Cannula SpO2: 97 % O2 Flow Rate (L/min): 2 L/min   Physical Exam:  Constitutional: Uncomfortable appearing obese young man visibly diaphoretic Eyes: conjunctiva non-erythematous Cardiovascular: tachycardic rhythm, no m/r/g Pulmonary/Chest: breath sounds diminished 2/2 body habitus Abdominal: soft, non-tender GU: Normal anatomy with no lesions, discharge, or suprapubic tenderness. MSK: Pain in his extremities Neurological: alert & answering questions appropriately Skin: warm and dry. No rashes, lesions, warmth or erythema noted Psych: flat affect  Filed Weights   09/23/21 2157  Weight: (!) 138.8 kg     Intake/Output Summary (Last 24 hours) at 09/24/2021 1110 Last data filed at 09/24/2021 0224 Gross per 24 hour  Intake 2000 ml  Output 1900 ml  Net 100 ml   Net IO Since Admission: 100 mL [09/24/21 1110]  Pertinent Labs: CBC Latest Ref Rng & Units 09/24/2021 09/23/2021 08/07/2017  WBC 4.0 - 10.5 K/uL 20.9(H) 14.2(H) 9.2  Hemoglobin 13.0 - 17.0 g/dL 62.7 03.5 12.4(L)  Hematocrit 39.0 - 52.0 % 40.7 42.2 36.6(L)  Platelets 150 - 400 K/uL 130(L) 199 165    CMP Latest Ref Rng & Units 09/24/2021 09/23/2021 08/07/2017  Glucose 70 - 99 mg/dL 009(F) 818(EX) 937(J)  BUN 6 - 20 mg/dL 8 10 8   Creatinine 0.61 - 1.24 mg/dL 6.96 7.89  Sodium 135 - 145 mmol/L 128(L) 131(L) 137   Potassium 3.5 - 5.1 mmol/L 4.6 4.0 4.1  Chloride 98 - 111 mmol/L 95(L) 94(L) 104  CO2 22 - 32 mmol/L 22 22 23   Calcium 8.9 - 10.3 mg/dL 3.81) 9.3 9.5  Total Protein 6.5 - 8.1 g/dL 7.4 7.3 8.0  Total Bilirubin 0.3 - 1.2 mg/dL 2.6(H) 2.0(H) 1.9(H)  Alkaline Phos 38 - 126 U/L 302(H) 129(H) 108  AST 15 - 41 U/L 92(H) 39 46(H)  ALT 0 - 44 U/L 65(H) 60(H) 66(H)    Imaging: DG Chest Port 1 View  Result Date: 09/23/2021 CLINICAL DATA:  Chest pain EXAM: PORTABLE CHEST 1 VIEW COMPARISON:  Chest x-ray 04/26/2017 FINDINGS: Heart appears mildly enlarged. Mediastinum is stable. Low lung volumes with crowding of the vasculature. No focal consolidation identified. No pleural effusion or pneumothorax visualized. IMPRESSION: Mild cardiomegaly and low lung volumes. Electronically Signed   By: 09/25/2021 M.D.   On: 09/23/2021 16:36   CT Angio Chest/Abd/Pel for Dissection W and/or Wo Contrast  Result Date: 09/23/2021 CLINICAL DATA:  Sickle cell pain crisis, chest pain, abdominal pain, aortic dissection suspected EXAM: CT ANGIOGRAPHY CHEST, ABDOMEN AND PELVIS TECHNIQUE: Non-contrast CT of the chest was initially obtained. Multidetector CT imaging through the chest, abdomen and pelvis was performed using the standard protocol during bolus administration of intravenous contrast. Multiplanar reconstructed images and MIPs were obtained and reviewed to evaluate the vascular anatomy. CONTRAST:  OMNIPAQUE IOHEXOL 350 MG/ML SOLN COMPARISON:  04/26/2017 FINDINGS: CTA CHEST FINDINGS Cardiovascular: Preferential opacification of the thoracic aorta. Normal contour and caliber of the thoracic aorta. No evidence of aneurysm, dissection, or other acute aortic pathology. No significant atherosclerosis. Normal heart size. No pericardial effusion. Enlargement of the main pulmonary artery, measuring up to 3.7 cm in caliber. Mediastinum/Nodes: No enlarged mediastinal, hilar, or axillary lymph nodes. Thymic remnant in the  anterior mediastinum. Thyroid gland, trachea, and esophagus demonstrate no significant findings. Lungs/Pleura: Lungs are clear. No pleural effusion or pneumothorax. Musculoskeletal: No chest wall abnormality. 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 Review of the MIP images confirms the above findings. CTA ABDOMEN AND PELVIS FINDINGS VASCULAR Normal contour and caliber of the abdominal aorta. No evidence of aneurysm, dissection, or other acute aortic pathology. Standard branching pattern of the abdominal aorta with solitary bilateral renal arteries. Review of the MIP images confirms the above findings. NON-VASCULAR Hepatobiliary: No solid liver abnormality is seen. Hepatic steatosis. Multiple small gallstones in the dependent gallbladder. Gallbladder wall thickening, or biliary dilatation. Pancreas: Unremarkable. No pancreatic ductal dilatation or surrounding inflammatory changes. Spleen: Splenomegaly, maximum coronal span 15.9 cm. Adrenals/Urinary Tract: Adrenal glands are unremarkable. Kidneys are normal, without renal calculi, solid lesion, or hydronephrosis. Bladder is unremarkable. Stomach/Bowel: Stomach is within normal limits. Appendix appears normal. No evidence of bowel wall thickening, distention, or inflammatory changes. Lymphatic: No enlarged abdominal or pelvic lymph nodes. Reproductive: No mass or other significant abnormality. Other: No abdominal wall hernia or abnormality. No abdominopelvic ascites. Musculoskeletal: Avascular necrosis of the bilateral femoral heads. Review of the MIP images confirms the above findings. IMPRESSION: 1. Normal contour and caliber of the thoracic and abdominal aorta. No evidence of aneurysm, dissection, or other acute aortic pathology. No significant atherosclerosis. 2. Enlargement of the main pulmonary artery, as can be seen in pulmonary hypertension. 3. Hepatic steatosis. 4. Splenomegaly, maximum coronal span 15.9 cm. 5. Cholelithiasis without evidence of  acute cholecystitis. 6. 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 Electronically Signed   By: Jearld Lesch M.D.   On: 09/23/2021 19:36    Assessment/Plan:   Principal Problem:   Sickle cell crisis [facility])   Patient Summary: Jaikob Ishimoto is a 25 y.o. with a pertinent PMH of PMHx of T2DM and sickle cell disease  who presented with diffuse body and admitted for sickle cell crisis and possible pericarditis.    #Sickle Cell Pain Crisis #Fever On exam today patient was in extreme discomfort and was diaphoretic. Pain not controlled with current medications. Will start a PCA pump. However later this morning patient developed a fever and a worsening leukocytosis suggesting that underlying cause of pain crisis might be related to infection. Infectious workup pending. No source of infection identified so far, CXR not suggestive of lung source, skin and genital exam unremarkable. Normal CK normal SED. Will start empiric antibiotics given increasing white count, fever, and tachycardia. - start ceftriaxone 2g daily -PCA for pain control -s/p toradol 15 - f/u Bcx, urine culture -IV NS @ 125 mL/hr for 12 hours -Telemetry -daily CBC CMP   #Possible Pericarditis #Enlargement of main pulmonary artery While in the ED, patient had new onset of stabbing chest pain radiating to the back and shoulders. EKG showed diffuse ST elevations, more consistent with pericarditis. Will start with empiric NSAID therapy which will also help with patient's pain. Cardiology has low suspicion for ACS at this time - s/p  15 toradol -Cardiology is on board, appreciate their recs -Telemetry - ECHO before discharge   #T2DM Most recent A1c of 8.1 in January 2022. Patient takes Toujeo 60 U nightly and 20 units of novolog TID at home. The patient had blood sugars elevated to the 500s on admit, and these have improved to 340 most recently. -Glargine 40  units nightly with SSI resistant -CBG four times daily  #Elevated LFTs #Elevated bilirubin and alkaline phosphatase Hepatic steatosis and cholelithiasis seen on CTAP without evidence of acute cholecystitis. 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   Scarlett Presto, MD Internal Medicine Resident PGY-1 Pager [phone] Please contact the on call pager after 5 pm and on weekends at [phone].

## 2021-09-24 NOTE — Progress Notes (Signed)
HD#1 Subjective:  Overnight Events: NAEO   Patient says pain is present in his back but it is tolerable. He knows that he has the PCA pump but did not use it much overnight because he was sleeping. Ate and drank some but does not have much of an appetite. Does have a pain in his chest when he takes a deep breath.   Objective:  Vital signs in last 24 hours: Vitals:   09/24/21 1614 09/24/21 1820 09/24/21 1843 09/24/21 1947  BP:  (!) 126/57 140/88 134/72  Pulse:    (!) 107  Resp: (!) 32 (!) 30 (!) 27 18  Temp:  100 F (37.8 C) (!) 100.9 F (38.3 C) 99 F (37.2 C)  TempSrc:  Oral Oral   SpO2: 97% 95% 97% 95%  Weight:      Height:       Supplemental O2: Nasal Cannula SpO2: 95 % O2 Flow Rate (L/min): 5 L/min   Physical Exam:  Constitutional: Uncomfortable appearing obese young man  Eyes: conjunctiva non-erythematous Cardiovascular: tachycardic rhythm, no m/r/g Pulmonary/Chest: breath sounds diminished 2/2 body habitus Abdominal: soft, non-tender MSK: moves all extremities spontaneously Neurological: alert & answering questions appropriately Skin: warm and dry. No rashes, lesions, warmth or erythema noted Psych: flat affect  Filed Weights   09/23/21 2157  Weight: (!) 138.8 kg     Intake/Output Summary (Last 24 hours) at 09/24/2021 2008 Last data filed at 09/24/2021 0224 Gross per 24 hour  Intake 2000 ml  Output 1900 ml  Net 100 ml   Net IO Since Admission: 100 mL [09/24/21 2008]  Pertinent Labs: CBC Latest Ref Rng & Units 09/24/2021 09/23/2021 08/07/2017  WBC 4.0 - 10.5 K/uL 20.9(H) 14.2(H) 9.2  Hemoglobin 13.0 - 17.0 g/dL 13.5 13.8 12.4(L)  Hematocrit 39.0 - 52.0 % 40.7 42.2 36.6(L)  Platelets 150 - 400 K/uL 130(L) 199 165    CMP Latest Ref Rng & Units 09/24/2021 09/23/2021 08/07/2017  Glucose 70 - 99 mg/dL 377(H) 539(HH) 106(H)  BUN 6 - 20 mg/dL '8 10 8  ' Creatinine 0.61 - 1.24 mg/dL 0.94 1.09 0.86  Sodium 135 - 145 mmol/L 128(L) 131(L) 137  Potassium  3.5 - 5.1 mmol/L 4.6 4.0 4.1  Chloride 98 - 111 mmol/L 95(L) 94(L) 104  CO2 22 - 32 mmol/L '22 22 23  ' Calcium 8.9 - 10.3 mg/dL 8.4(L) 9.3 9.5  Total Protein 6.5 - 8.1 g/dL 7.4 7.3 8.0  Total Bilirubin 0.3 - 1.2 mg/dL 2.6(H) 2.0(H) 1.9(H)  Alkaline Phos 38 - 126 U/L 302(H) 129(H) 108  AST 15 - 41 U/L 92(H) 39 46(H)  ALT 0 - 44 U/L 65(H) 60(H) 66(H)    Imaging: No results found.  Assessment/Plan:   Principal Problem:   Sickle cell crisis [facility])   Patient Summary: Ricky Cantrell is a 25 y.o. with a pertinent PMH of PMHx of T2DM and sickle cell disease  who presented with diffuse body and admitted for sickle cell crisis and possible pericarditis.      #Sickle Cell Pain Crisis #Fever Started PCA pump yesterday as well as empiric antibiotics given leukocytosis. Patient appears very uncomfortable on exam but is not really talking very much. Will try and get his pain under better control today - ceftriaxone 2g daily -PCA for pain control - consult to sickle cell team, appreciate assistance - ibuprofen 600 TID - f/u Bcx, urine culture - IV NS @ 125 mL/hr  -Telemetry -daily CBC CMP  #Elevated LFTs #Elevated bilirubin and alkaline  phosphatase Hepatic steatosis and cholelithiasis seen on CTAP without evidence of acute cholecystitis. 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 -MRCP ordered   #Possible Pericarditis #Enlargement of main pulmonary artery While in the ED, patient had new onset of stabbing chest pain radiating to the back and shoulders. EKG showed diffuse ST elevations, more consistent with pericarditis. Will start with empiric NSAID therapy which will also help with patient's pain. Cardiology has low suspicion for ACS at this time. Will repeat EKG today - f/u repeat EKG - s/p 15 toradol - ibuprofen 600 TID, colchicine 0.6 daily -Cardiology is on board, appreciate their recs -Telemetry - f/u  echo   #T2DM Most recent A1c of 8.1 in January 2022. Patient takes Toujeo 60 U nightly and 20 units of novolog TID at home. The patient had blood sugars elevated to the 500s on admit, and these have improved to 340 most recently. 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

## 2021-09-25 ENCOUNTER — Inpatient Hospital Stay (HOSPITAL_COMMUNITY): Payer: BC Managed Care – PPO

## 2021-09-25 DIAGNOSIS — R7989 Other specified abnormal findings of blood chemistry: Secondary | ICD-10-CM

## 2021-09-25 DIAGNOSIS — R9431 Abnormal electrocardiogram [ECG] [EKG]: Secondary | ICD-10-CM

## 2021-09-25 DIAGNOSIS — R778 Other specified abnormalities of plasma proteins: Secondary | ICD-10-CM

## 2021-09-25 DIAGNOSIS — R079 Chest pain, unspecified: Secondary | ICD-10-CM

## 2021-09-25 DIAGNOSIS — D5700 Hb-SS disease with crisis, unspecified: Principal | ICD-10-CM

## 2021-09-25 LAB — URINE CULTURE: Culture: NO GROWTH

## 2021-09-25 LAB — ECHOCARDIOGRAM COMPLETE
AR max vel: 3.01 cm2
AV Area VTI: 3.62 cm2
AV Area mean vel: 3.63 cm2
AV Mean grad: 5 mmHg
AV Peak grad: 9.7 mmHg
Ao pk vel: 1.56 m/s
Area-P 1/2: 6.27 cm2
Calc EF: 65.7 %
Height: 72 in
S' Lateral: 3.1 cm
Single Plane A2C EF: 63.2 %
Single Plane A4C EF: 68.7 %
Weight: 4896 oz

## 2021-09-25 LAB — GAMMA GT: GGT: 31 U/L (ref 7–50)

## 2021-09-25 LAB — HEMOGLOBIN A1C
Hgb A1c MFr Bld: 10.3 % — ABNORMAL HIGH (ref 4.8–5.6)
Mean Plasma Glucose: 249 mg/dL

## 2021-09-25 LAB — COMPREHENSIVE METABOLIC PANEL
ALT: 54 U/L — ABNORMAL HIGH (ref 0–44)
AST: 69 U/L — ABNORMAL HIGH (ref 15–41)
Albumin: 3.2 g/dL — ABNORMAL LOW (ref 3.5–5.0)
Alkaline Phosphatase: 285 U/L — ABNORMAL HIGH (ref 38–126)
Anion gap: 7 (ref 5–15)
BUN: 6 mg/dL (ref 6–20)
CO2: 25 mmol/L (ref 22–32)
Calcium: 8.6 mg/dL — ABNORMAL LOW (ref 8.9–10.3)
Chloride: 102 mmol/L (ref 98–111)
Creatinine, Ser: 0.99 mg/dL (ref 0.61–1.24)
GFR, Estimated: >60 mL/min (ref >60)
Glucose, Bld: 229 mg/dL — ABNORMAL HIGH (ref 70–99)
Potassium: 3.7 mmol/L (ref 3.5–5.1)
Sodium: 134 mmol/L — ABNORMAL LOW (ref 135–145)
Total Bilirubin: 3.2 mg/dL — ABNORMAL HIGH (ref 0.3–1.2)
Total Protein: 6.6 g/dL (ref 6.5–8.1)

## 2021-09-25 LAB — TECHNOLOGIST SMEAR REVIEW

## 2021-09-25 LAB — GLUCOSE, CAPILLARY
Glucose-Capillary: 272 mg/dL — ABNORMAL HIGH (ref 70–99)
Glucose-Capillary: 253 mg/dL — ABNORMAL HIGH (ref 70–99)
Glucose-Capillary: 192 mg/dL — ABNORMAL HIGH (ref 70–99)
Glucose-Capillary: 242 mg/dL — ABNORMAL HIGH (ref 70–99)

## 2021-09-25 LAB — CBC
HCT: 36.9 % — ABNORMAL LOW (ref 39.0–52.0)
Hemoglobin: 11.8 g/dL — ABNORMAL LOW (ref 13.0–17.0)
MCH: 23 pg — ABNORMAL LOW (ref 26.0–34.0)
MCHC: 32 g/dL (ref 30.0–36.0)
MCV: 71.8 fL — ABNORMAL LOW (ref 80.0–100.0)
Platelets: 84 10*3/uL — ABNORMAL LOW (ref 150–400)
RBC: 5.14 MIL/uL (ref 4.22–5.81)
RDW: 14.6 % (ref 11.5–15.5)
WBC: 11.2 10*3/uL — ABNORMAL HIGH (ref 4.0–10.5)
nRBC: 1.3 % — ABNORMAL HIGH (ref 0.0–0.2)

## 2021-09-25 LAB — LACTATE DEHYDROGENASE: LDH: 1660 U/L — ABNORMAL HIGH (ref 98–192)

## 2021-09-25 MED ORDER — FOLIC ACID 1 MG PO TABS
1.0000 mg | ORAL_TABLET | Freq: Every day | ORAL | Status: DC
  Administered 2021-09-25 – 2021-09-28 (×4): 1 mg via ORAL
  Filled 2021-09-25 (×4): qty 1

## 2021-09-25 MED ORDER — INSULIN ASPART 100 UNIT/ML IJ SOLN
0.0000 [IU] | Freq: Every day | INTRAMUSCULAR | Status: DC
  Administered 2021-09-25 – 2021-09-26 (×2): 2 [IU] via SUBCUTANEOUS

## 2021-09-25 MED ORDER — KETOROLAC TROMETHAMINE 15 MG/ML IJ SOLN
15.0000 mg | Freq: Four times a day (QID) | INTRAMUSCULAR | Status: DC
  Administered 2021-09-26 – 2021-09-28 (×5): 15 mg via INTRAVENOUS
  Filled 2021-09-25 (×6): qty 1

## 2021-09-25 MED ORDER — SODIUM CHLORIDE 0.45 % IV SOLN: INTRAVENOUS | Status: DC

## 2021-09-25 MED ORDER — INSULIN ASPART 100 UNIT/ML IJ SOLN
0.0000 [IU] | Freq: Three times a day (TID) | INTRAMUSCULAR | Status: DC
  Administered 2021-09-25: 4 [IU] via SUBCUTANEOUS
  Administered 2021-09-26: 7 [IU] via SUBCUTANEOUS
  Administered 2021-09-26: 11 [IU] via SUBCUTANEOUS
  Administered 2021-09-26 – 2021-09-27 (×3): 7 [IU] via SUBCUTANEOUS
  Administered 2021-09-27: 11 [IU] via SUBCUTANEOUS
  Administered 2021-09-28: 7 [IU] via SUBCUTANEOUS

## 2021-09-25 MED ORDER — SODIUM CHLORIDE 0.9 % IV SOLN: INTRAVENOUS | Status: DC

## 2021-09-25 MED ORDER — INSULIN GLARGINE-YFGN 100 UNIT/ML ~~LOC~~ SOLN
50.0000 [IU] | Freq: Every day | SUBCUTANEOUS | Status: DC
  Administered 2021-09-25: 50 [IU] via SUBCUTANEOUS
  Filled 2021-09-25 (×2): qty 0.5

## 2021-09-25 MED ORDER — PERFLUTREN LIPID MICROSPHERE
1.0000 mL | INTRAVENOUS | Status: AC | PRN
Start: 2021-09-25 — End: 2021-09-25
  Administered 2021-09-25: 2 mL via INTRAVENOUS
  Filled 2021-09-25: qty 10

## 2021-09-25 NOTE — Progress Notes (Signed)
Progress Note  Patient Name: Ricky Cantrell Date of Encounter: 09/25/2021  [place] Cardiologist: None   Subjective   Feeling poorly.  Chest pain with inspiration is improving but still present.  No exertional chest pain.   Inpatient Medications    Scheduled Meds:  colchicine  0.6 mg Oral Daily   enoxaparin (LOVENOX) injection  40 mg Subcutaneous Q24H   HYDROmorphone   Intravenous Q4H   insulin aspart  0-20 Units Subcutaneous TID WC   insulin glargine-yfgn  40 Units Subcutaneous QHS   ketorolac  15 mg Intravenous Q6H   oxyCODONE  10 mg Oral Q6H   Continuous Infusions:  sodium chloride 100 mL/hr at 09/25/21 1001   cefTRIAXone (ROCEPHIN)  IV Stopped (09/24/21 1328)   diphenhydrAMINE     PRN Meds: acetaminophen **OR** acetaminophen, diphenhydrAMINE **OR** diphenhydrAMINE, naloxone **AND** sodium chloride flush, ondansetron (ZOFRAN) IV, polyethylene glycol   Vital Signs    Vitals:   09/25/21 0055 09/25/21 0400 09/25/21 0822 09/25/21 0858  BP:   132/65   Pulse:   (!) 110   Resp: (!) 30 (!) 30 18 (!) 23  Temp:   98.4 F (36.9 C)   TempSrc:   Oral   SpO2: 98% 98% 94% 95%  Weight:      Height:        Intake/Output Summary (Last 24 hours) at 09/25/2021 1136 Last data filed at 09/25/2021 0730 Gross per 24 hour  Intake 0 ml  Output 1000 ml  Net -1000 ml   Last 3 Weights 09/23/2021 08/07/2017 04/26/2017  Weight (lbs) 306 lb 340 lb 335 lb  Weight (kg) 138.801 kg 154.223 kg 151.955 kg      Telemetry    Sinus rhythm, sinus tachycardia.  - Personally Reviewed  ECG    Sinus tachycardia.  Rate 109 bpm. - Personally Reviewed  Physical Exam   GEN: Appears uncomfortable.  Diaphoretic.  Neck: No JVD Cardiac: Tachycardic.  Regular rhythm.  No murmurs, rubs, or gallops.  Respiratory: Clear to auscultation bilaterally. GI: Soft, nontender, non-distended  MS: No edema; No deformity. Neuro:  Nonfocal  Psych: Normal affect   Labs    High Sensitivity Troponin:    Recent Labs  Lab 09/23/21 1522 09/23/21 1722 09/23/21 2200 09/24/21 0240  TROPONINIHS 22* 38* 56* 45*     Chemistry Recent Labs  Lab 09/23/21 1506 09/24/21 0240 09/25/21 0302  NA 131* 128* 134*  K 4.0 4.6 3.7  CL 94* 95* 102  CO2 $Re'22 22 25  'iOL$ GLUCOSE 539* 377* 229*  BUN $Re'10 8 6  'UzG$ CREATININE 1.09 0.94 0.99  CALCIUM 9.3 8.4* 8.6*  PROT 7.3 7.4 6.6  ALBUMIN 3.6 3.7 3.2*  AST 39 92* 69*  ALT 60* 65* 54*  ALKPHOS 129* 302* 285*  BILITOT 2.0* 2.6* 3.2*  GFRNONAA >60 >60 >60  ANIONGAP $RemoveB'15 11 7    'kKolxWxb$ Lipids No results for input(s): CHOL, TRIG, HDL, LABVLDL, LDLCALC, CHOLHDL in the last 168 hours.  Hematology Recent Labs  Lab 09/23/21 1506 09/24/21 0240 09/25/21 0302  WBC 14.2* 20.9* 11.2*  RBC 6.02*  5.98* 5.82* 5.14  HGB 13.8 13.5 11.8*  HCT 42.2 40.7 36.9*  MCV 70.1* 69.9* 71.8*  MCH 22.9* 23.2* 23.0*  MCHC 32.7 33.2 32.0  RDW 15.5 15.0 14.6  PLT 199 130* 84*   Thyroid No results for input(s): TSH, FREET4 in the last 168 hours.  BNPNo results for input(s): BNP, PROBNP in the last 168 hours.  DDimer No results for input(s): DDIMER  in the last 168 hours.   Radiology    DG Chest Port 1 View  Result Date: 09/23/2021 CLINICAL DATA:  Chest pain EXAM: PORTABLE CHEST 1 VIEW COMPARISON:  Chest x-ray 04/26/2017 FINDINGS: Heart appears mildly enlarged. Mediastinum is stable. Low lung volumes with crowding of the vasculature. No focal consolidation identified. No pleural effusion or pneumothorax visualized. IMPRESSION: Mild cardiomegaly and low lung volumes. Electronically Signed   By: Ofilia Neas M.D.   On: 09/23/2021 16:36   CT Angio Chest/Abd/Pel for Dissection W and/or Wo Contrast  Result Date: 09/23/2021 CLINICAL DATA:  Sickle cell pain crisis, chest pain, abdominal pain, aortic dissection suspected EXAM: CT ANGIOGRAPHY CHEST, ABDOMEN AND PELVIS TECHNIQUE: Non-contrast CT of the chest was initially obtained. Multidetector CT imaging through the chest, abdomen and  pelvis was performed using the standard protocol during bolus administration of intravenous contrast. Multiplanar reconstructed images and MIPs were obtained and reviewed to evaluate the vascular anatomy. CONTRAST:  151mL OMNIPAQUE IOHEXOL 350 MG/ML SOLN COMPARISON:  04/26/2017 FINDINGS: CTA CHEST FINDINGS Cardiovascular: Preferential opacification of the thoracic aorta. Normal contour and caliber of the thoracic aorta. No evidence of aneurysm, dissection, or other acute aortic pathology. No significant atherosclerosis. Normal heart size. No pericardial effusion. Enlargement of the main pulmonary artery, measuring up to 3.7 cm in caliber. Mediastinum/Nodes: No enlarged mediastinal, hilar, or axillary lymph nodes. Thymic remnant in the anterior mediastinum. Thyroid gland, trachea, and esophagus demonstrate no significant findings. Lungs/Pleura: Lungs are clear. No pleural effusion or pneumothorax. Musculoskeletal: No chest wall abnormality. 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 Review of the MIP images confirms the above findings. CTA ABDOMEN AND PELVIS FINDINGS VASCULAR Normal contour and caliber of the abdominal aorta. No evidence of aneurysm, dissection, or other acute aortic pathology. Standard branching pattern of the abdominal aorta with solitary bilateral renal arteries. Review of the MIP images confirms the above findings. NON-VASCULAR Hepatobiliary: No solid liver abnormality is seen. Hepatic steatosis. Multiple small gallstones in the dependent gallbladder. Gallbladder wall thickening, or biliary dilatation. Pancreas: Unremarkable. No pancreatic ductal dilatation or surrounding inflammatory changes. Spleen: Splenomegaly, maximum coronal span 15.9 cm. Adrenals/Urinary Tract: Adrenal glands are unremarkable. Kidneys are normal, without renal calculi, solid lesion, or hydronephrosis. Bladder is unremarkable. Stomach/Bowel: Stomach is within normal limits. Appendix appears normal. No evidence of  bowel wall thickening, distention, or inflammatory changes. Lymphatic: No enlarged abdominal or pelvic lymph nodes. Reproductive: No mass or other significant abnormality. Other: No abdominal wall hernia or abnormality. No abdominopelvic ascites. Musculoskeletal: Avascular necrosis of the bilateral femoral heads. Review of the MIP images confirms the above findings. IMPRESSION: 1. Normal contour and caliber of the thoracic and abdominal aorta. No evidence of aneurysm, dissection, or other acute aortic pathology. No significant atherosclerosis. 2. Enlargement of the main pulmonary artery, as can be seen in pulmonary hypertension. 3. Hepatic steatosis. 4. Splenomegaly, maximum coronal span 15.9 cm. 5. Cholelithiasis without evidence of acute cholecystitis. 6. 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 Electronically Signed   By: Delanna Ahmadi M.D.   On: 09/23/2021 19:36    Cardiac Studies   Echo pending  Patient Profile     25 y.o. male  with diabetes, SS anemia, and morbid obesity admitted with SS crisis. Cardiology consulted for chest pain and elevated troponin.   Assessment & Plan    # SS crisis: # chest pain:   Patient admitted with joint pain and chest pain consistent with SS  crisis.  No clear infectious etiology, but he has a leukocytosis and fever.  Improving with IV fluids and antibiotics.  Chest pain is not consistent with an ischemic etiology.  HS troponin peaked at 56 and now downtrending.  There was concern for pericarditis given that on admission his ST segments were more elevated than prior.  ESR was 2, making this less likely.  Today his EKG unchanged from 2018.  Echo pending.  No pericardial effusion or thickening on CT.      For questions or updates, please contact [place] Please consult [email or web address] for contact info under        Signed, Skeet Latch, MD  09/25/2021, 11:36 AM

## 2021-09-25 NOTE — Consult Note (Signed)
Reason for Consult:Sickle cell pain crisis Referring Physician: Scarlett Presto   HPI:  Ricky Cantrell is a 25 year old male with a medical history significant for sickle cell disease, obesity and type 2 diabetes mellitus that was admitted for sickle cell pain crisis.  Patient says that he awakened suddenly with pain all over and primarily to chest with inspiration.  Patient says that he has not had a sickle cell crisis in around 5 years.  He is currently a Equities trader at [employer].  He attributes this pain crisis to changes in weather.  He states that he is no longer having chest pain.  Pain is primarily to low back and lower extremities.  Pain intensity is 6/10, which is improving. He characterizes this pain as intermittent and sharp.  Patient is mostly opiate nave.  He primarily takes over-the-counter ibuprofen and Tylenol for pain control.  He is not on any disease modifying agents for sickle cell disease.  He denies headache, dizziness, urinary symptoms, nausea, vomiting, or diarrhea. Patient has a history of type 2 diabetes mellitus.  He states that diabetes was diagnosed last year during his routine physical exam.  He has been taking insulin since that time.  He says that he has been taking medications consistently.  He does not follow a low-fat, low carbohydrate diet.  Patient's mother and college roommate is at the bedside.    Past Medical History:  Diagnosis Date   Sickle cell anemia ([place])     History reviewed. No pertinent surgical history.  History reviewed. No pertinent family history.  Social History:  reports that he has never smoked. He has never used smokeless tobacco. He reports that he does not drink alcohol and does not use drugs.  Allergies: No Known Allergies  Medications: I have reviewed the patient's current medications.  Results for orders placed or performed during the hospital encounter of 09/23/21 (from the past 48 hour(s))  Comprehensive metabolic panel      Status: Abnormal   Collection Time: 09/23/21  3:06 PM  Result Value Ref Range   Sodium 131 (L) 135 - 145 mmol/L   Potassium 4.0 3.5 - 5.1 mmol/L   Chloride 94 (L) 98 - 111 mmol/L   CO2 22 22 - 32 mmol/L   Glucose, Bld 539 (HH) 70 - 99 mg/dL    Comment: Glucose reference range applies only to samples taken after fasting for at least 8 hours. CRITICAL RESULT CALLED TO, READ BACK BY AND VERIFIED WITH: C.MAS RN @ 1721 09/23/2021 BY C.EDENS    BUN 10 6 - 20 mg/dL   Creatinine, Ser 1.09 0.61 - 1.24 mg/dL   Calcium 9.3 8.9 - 10.3 mg/dL   Total Protein 7.3 6.5 - 8.1 g/dL   Albumin 3.6 3.5 - 5.0 g/dL   AST 39 15 - 41 U/L   ALT 60 (H) 0 - 44 U/L   Alkaline Phosphatase 129 (H) 38 - 126 U/L   Total Bilirubin 2.0 (H) 0.3 - 1.2 mg/dL   GFR, Estimated >60 >60 mL/min    Comment: (NOTE) Calculated using the CKD-EPI Creatinine Equation (2021)    Anion gap 15 5 - 15    Comment: Performed at [facility], [place] [address] [place], [place] [address]  CBC with Differential     Status: Abnormal   Collection Time: 09/23/21  3:06 PM  Result Value Ref Range   WBC 14.2 (H) 4.0 - 10.5 K/uL   RBC 6.02 (H) 4.22 - 5.81 MIL/uL  Hemoglobin 13.8 13.0 - 17.0 g/dL   HCT 42.2 39.0 - 52.0 %   MCV 70.1 (L) 80.0 - 100.0 fL   MCH 22.9 (L) 26.0 - 34.0 pg   MCHC 32.7 30.0 - 36.0 g/dL   RDW 15.5 11.5 - 15.5 %   Platelets 199 150 - 400 K/uL   nRBC 1.0 (H) 0.0 - 0.2 %   Neutrophils Relative % 58 %   Neutro Abs 8.2 (H) 1.7 - 7.7 K/uL   Lymphocytes Relative 24 %   Lymphs Abs 3.4 0.7 - 4.0 K/uL   Monocytes Relative 5 %   Monocytes Absolute 0.7 0.1 - 1.0 K/uL   Eosinophils Relative 0 %   Eosinophils Absolute 0.0 0.0 - 0.5 K/uL   Basophils Relative 1 %   Basophils Absolute 0.1 0.0 - 0.1 K/uL   WBC Morphology See Note     Comment: Mild Left Shift. 1 to 5% Metas and Myelos, Occ Pro Noted.   nRBC 3 (H) 0 /100 WBC   Metamyelocytes Relative 4 %   Myelocytes 8 %   Abs Immature Granulocytes 1.70 (H) 0.00 - 0.07  K/uL   Tear Drop Cells PRESENT    Polychromasia PRESENT     Comment: Performed at [facility] [address], [place], [place] [address]  Reticulocytes     Status: Abnormal   Collection Time: 09/23/21  3:06 PM  Result Value Ref Range   Retic Ct Pct 3.4 (H) 0.4 - 3.1 %   RBC. 5.98 (H) 4.22 - 5.81 MIL/uL   Retic Count, Absolute 204.5 (H) 19.0 - 186.0 K/uL   Immature Retic Fract 48.9 (H) 2.3 - 15.9 %    Comment: Performed at [facility] [address] [place], [place] [address]  Troponin I (High Sensitivity)     Status: Abnormal   Collection Time: 09/23/21  3:22 PM  Result Value Ref Range   Troponin I (High Sensitivity) 22 (H) <18 ng/L    Comment: (NOTE) Elevated high sensitivity troponin I (hsTnI) values and significant  changes across serial measurements may suggest ACS but many other  chronic and acute conditions are known to elevate hsTnI results.  Refer to the "Links" section for chest pain algorithms and additional  guidance. Performed at [facility], [place] [address] [place], [place] [address]   Troponin I (High Sensitivity)     Status: Abnormal   Collection Time: 09/23/21  5:22 PM  Result Value Ref Range   Troponin I (High Sensitivity) 38 (H) <18 ng/L    Comment: (NOTE) Elevated high sensitivity troponin I (hsTnI) values and significant  changes across serial measurements may suggest ACS but many other  chronic and acute conditions are known to elevate hsTnI results.  Refer to the "Links" section for chest pain algorithms and additional  guidance. Performed at [facility], [place] [address], [place], [place] [address]   CBG monitoring, ED     Status: Abnormal   Collection Time: 09/23/21  7:36 PM  Result Value Ref Range   Glucose-Capillary 403 (H) 70 - 99 mg/dL    Comment: Glucose reference range applies only to samples taken after fasting for at least 8 hours.  HIV Antibody (routine testing w rflx)     Status: None   Collection Time:  09/23/21 10:00 PM  Result Value Ref Range   HIV Screen 4th Generation wRfx Non Reactive Non Reactive    Comment: Performed at [facility], [address] [place], [place] [address]  Hemoglobin A1c     Status: Abnormal   Collection Time: 09/23/21 10:00 PM  Result Value Ref Range   Hgb A1c MFr Bld 10.3 (H) 4.8 - 5.6 %    Comment: (NOTE)         Prediabetes: 5.7 - 6.4         Diabetes: >6.4         Glycemic control for adults with diabetes: <7.0    Mean Plasma Glucose 249 mg/dL    Comment: (NOTE) Performed At: [facility] [place] [address] [place], [place] [id number] Jolene Schimke MD KG:[id number]   Troponin I (High Sensitivity)     Status: Abnormal   Collection Time: 09/23/21 10:00 PM  Result Value Ref Range   Troponin I (High Sensitivity) 56 (H) <18 ng/L    Comment: (NOTE) Elevated high sensitivity troponin I (hsTnI) values and significant  changes across serial measurements may suggest ACS but many other  chronic and acute conditions are known to elevate hsTnI results.  Refer to the "Links" section for chest pain algorithms and additional  guidance. Performed at [facility], [address] [place], [place] [address]   C-reactive protein     Status: Abnormal   Collection Time: 09/23/21 10:00 PM  Result Value Ref Range   CRP 1.1 (H) <1.0 mg/dL    Comment: Performed at [facility], [address] [place], [place] [address]  Resp Panel by RT-PCR (Flu A&B, Covid) Nasopharyngeal Swab     Status: None   Collection Time: 09/23/21 10:16 PM   Specimen: Nasopharyngeal Swab; Nasopharyngeal(NP) swabs in vial transport medium  Result Value Ref Range   SARS Coronavirus 2 by RT PCR NEGATIVE NEGATIVE    Comment: (NOTE) SARS-CoV-2 target nucleic acids are NOT DETECTED.  The SARS-CoV-2 RNA is generally detectable in upper respiratory specimens during the acute phase of infection. The lowest concentration of SARS-CoV-2 viral copies this assay can detect  is 138 copies/mL. A negative result does not preclude SARS-Cov-2 infection and should not be used as the sole basis for treatment or other patient management decisions. A negative result may occur with  improper specimen collection/handling, submission of specimen other than nasopharyngeal swab, presence of viral mutation(s) within the areas targeted by this assay, and inadequate number of viral copies(<138 copies/mL). A negative result must be combined with clinical observations, patient history, and epidemiological information. The expected result is Negative.  Fact Sheet for Patients:  BloggerCourse.com  Fact Sheet for Healthcare Providers:  SeriousBroker.it  This test is no t yet approved or cleared by the [place] FDA and  has been authorized for detection and/or diagnosis of SARS-CoV-2 by FDA under an Emergency Use Authorization (EUA). This EUA will remain  in effect (meaning this test can be used) for the duration of the COVID-19 declaration under Section 564(b)(1) of the Act, 21 U.S.C.section 360bbb-3(b)(1), unless the authorization is terminated  or revoked sooner.       Influenza A by PCR NEGATIVE NEGATIVE   Influenza B by PCR NEGATIVE NEGATIVE    Comment: (NOTE) The Xpert Xpress SARS-CoV-2/FLU/RSV plus assay is intended as an aid in the diagnosis of influenza from Nasopharyngeal swab specimens and should not be used as a sole basis for treatment. Nasal washings and aspirates are unacceptable for Xpert Xpress SARS-CoV-2/FLU/RSV testing.  Fact Sheet for Patients: BloggerCourse.com  Fact Sheet for Healthcare Providers: SeriousBroker.it  This test is not yet approved or cleared by  the [employer] and has been authorized for detection and/or diagnosis of SARS-CoV-2 by FDA under an Emergency Use Authorization (EUA). This EUA will remain in effect (meaning this  test can be used) for the duration of the COVID-19 declaration under Section 564(b)(1) of the Act, 21 U.S.C. section 360bbb-3(b)(1), unless the authorization is terminated or revoked.  Performed at [facility], [place] [address], [place], [place] [address]   CBG monitoring, ED     Status: Abnormal   Collection Time: 09/23/21 10:26 PM  Result Value Ref Range   Glucose-Capillary 342 (H) 70 - 99 mg/dL    Comment: Glucose reference range applies only to samples taken after fasting for at least 8 hours.  Comprehensive metabolic panel     Status: Abnormal   Collection Time: 09/24/21  2:40 AM  Result Value Ref Range   Sodium 128 (L) 135 - 145 mmol/L   Potassium 4.6 3.5 - 5.1 mmol/L   Chloride 95 (L) 98 - 111 mmol/L   CO2 22 22 - 32 mmol/L   Glucose, Bld 377 (H) 70 - 99 mg/dL    Comment: Glucose reference range applies only to samples taken after fasting for at least 8 hours.   BUN 8 6 - 20 mg/dL   Creatinine, Ser 0.94 0.61 - 1.24 mg/dL   Calcium 8.4 (L) 8.9 - 10.3 mg/dL   Total Protein 7.4 6.5 - 8.1 g/dL   Albumin 3.7 3.5 - 5.0 g/dL   AST 92 (H) 15 - 41 U/L   ALT 65 (H) 0 - 44 U/L   Alkaline Phosphatase 302 (H) 38 - 126 U/L   Total Bilirubin 2.6 (H) 0.3 - 1.2 mg/dL   GFR, Estimated >60 >60 mL/min    Comment: (NOTE) Calculated using the CKD-EPI Creatinine Equation (2021)    Anion gap 11 5 - 15    Comment: Performed at [facility], [place] [address], [place], [place] [address]  CBC     Status: Abnormal   Collection Time: 09/24/21  2:40 AM  Result Value Ref Range   WBC 20.9 (H) 4.0 - 10.5 K/uL   RBC 5.82 (H) 4.22 - 5.81 MIL/uL   Hemoglobin 13.5 13.0 - 17.0 g/dL   HCT 40.7 39.0 - 52.0 %   MCV 69.9 (L) 80.0 - 100.0 fL   MCH 23.2 (L) 26.0 - 34.0 pg   MCHC 33.2 30.0 - 36.0 g/dL   RDW 15.0 11.5 - 15.5 %   Platelets 130 (L) 150 - 400 K/uL   nRBC 2.3 (H) 0.0 - 0.2 %    Comment: Performed at [facility] [address], [place], [place] [address]  Troponin I (High  Sensitivity)     Status: Abnormal   Collection Time: 09/24/21  2:40 AM  Result Value Ref Range   Troponin I (High Sensitivity) 45 (H) <18 ng/L    Comment: (NOTE) Elevated high sensitivity troponin I (hsTnI) values and significant  changes across serial measurements may suggest ACS but many other  chronic and acute conditions are known to elevate hsTnI results.  Refer to the "Links" section for chest pain algorithms and additional  guidance. Performed at [facility], [place] [address] [place], [place] [address]   Sedimentation rate     Status: None   Collection Time: 09/24/21  7:43 AM  Result Value Ref Range   Sed Rate 2 0 - 16 mm/hr    Comment: Performed at [facility],  [address] [place], [place] [address]  CK     Status: None   Collection Time: 09/24/21  7:43 AM  Result Value Ref Range   Total CK 137 49 - 397 U/L    Comment: Performed at [facility], [place] [address] [place], [place] [address]  CBG monitoring, ED     Status: Abnormal   Collection Time: 09/24/21  7:50 AM  Result Value Ref Range   Glucose-Capillary 386 (H) 70 - 99 mg/dL    Comment: Glucose reference range applies only to samples taken after fasting for at least 8 hours.  Urinalysis, Routine w reflex microscopic Urine, Clean Catch     Status: Abnormal   Collection Time: 09/24/21 10:03 AM  Result Value Ref Range   Color, Urine YELLOW YELLOW   APPearance CLEAR CLEAR   Specific Gravity, Urine 1.013 1.005 - 1.030   pH 5.0 5.0 - 8.0   Glucose, UA >=500 (A) NEGATIVE mg/dL   Hgb urine dipstick NEGATIVE NEGATIVE   Bilirubin Urine NEGATIVE NEGATIVE   Ketones, ur NEGATIVE NEGATIVE mg/dL   Protein, ur NEGATIVE NEGATIVE mg/dL   Nitrite NEGATIVE NEGATIVE   Leukocytes,Ua NEGATIVE NEGATIVE   RBC / HPF 0-5 0 - 5 RBC/hpf   WBC, UA 0-5 0 - 5 WBC/hpf   Bacteria, UA NONE SEEN NONE SEEN   Squamous Epithelial / LPF 0-5 0 - 5    Comment: Performed at [facility], [place] [address] [place],  [place] [address]  CBG monitoring, ED     Status: Abnormal   Collection Time: 09/24/21 12:04 PM  Result Value Ref Range   Glucose-Capillary 281 (H) 70 - 99 mg/dL    Comment: Glucose reference range applies only to samples taken after fasting for at least 8 hours.  Glucose, capillary     Status: Abnormal   Collection Time: 09/24/21  3:12 PM  Result Value Ref Range   Glucose-Capillary 256 (H) 70 - 99 mg/dL    Comment: Glucose reference range applies only to samples taken after fasting for at least 8 hours.  Glucose, capillary     Status: Abnormal   Collection Time: 09/24/21  9:13 PM  Result Value Ref Range   Glucose-Capillary 252 (H) 70 - 99 mg/dL    Comment: Glucose reference range applies only to samples taken after fasting for at least 8 hours.  CBC     Status: Abnormal   Collection Time: 09/25/21  3:02 AM  Result Value Ref Range   WBC 11.2 (H) 4.0 - 10.5 K/uL   RBC 5.14 4.22 - 5.81 MIL/uL   Hemoglobin 11.8 (L) 13.0 - 17.0 g/dL   HCT 36.9 (L) 39.0 - 52.0 %   MCV 71.8 (L) 80.0 - 100.0 fL   MCH 23.0 (L) 26.0 - 34.0 pg   MCHC 32.0 30.0 - 36.0 g/dL   RDW 14.6 11.5 - 15.5 %   Platelets 84 (L) 150 - 400 K/uL    Comment: Immature Platelet Fraction may be clinically indicated, consider ordering this additional test [id number] REPEATED TO VERIFY PLATELET COUNT CONFIRMED BY SMEAR    nRBC 1.3 (H) 0.0 - 0.2 %    Comment: Performed at [facility], [place] [address], [place],  [address]  Comprehensive metabolic panel     Status: Abnormal   Collection Time: 09/25/21  3:02 AM  Result Value Ref Range   Sodium 134 (L) 135 - 145 mmol/L   Potassium 3.7 3.5 - 5.1 mmol/L  Chloride 102 98 - 111 mmol/L   CO2 25 22 - 32 mmol/L   Glucose, Bld 229 (H) 70 - 99 mg/dL    Comment: Glucose reference range applies only to samples taken after fasting for at least 8 hours.   BUN 6 6 - 20 mg/dL   Creatinine, Ser 1.610.99 0.61 - 1.24 mg/dL   Calcium 8.6 (L) 8.9 - 10.3 mg/dL   Total Protein 6.6 6.5 - 8.1  g/dL   Albumin 3.2 (L) 3.5 - 5.0 g/dL   AST 69 (H) 15 - 41 U/L   ALT 54 (H) 0 - 44 U/L   Alkaline Phosphatase 285 (H) 38 - 126 U/L   Total Bilirubin 3.2 (H) 0.3 - 1.2 mg/dL   GFR, Estimated >09>60 >60>60 mL/min    Comment: (NOTE) Calculated using the CKD-EPI Creatinine Equation (2021)    Anion gap 7 5 - 15    Comment: Performed at [facility], [address] [place], [place] [id number]  Glucose, capillary     Status: Abnormal   Collection Time: 09/25/21  8:20 AM  Result Value Ref Range   Glucose-Capillary 272 (H) 70 - 99 mg/dL    Comment: Glucose reference range applies only to samples taken after fasting for at least 8 hours.    DG Chest Port 1 View  Result Date: 09/23/2021 CLINICAL DATA:  Chest pain EXAM: PORTABLE CHEST 1 VIEW COMPARISON:  Chest x-ray 04/26/2017 FINDINGS: Heart appears mildly enlarged. Mediastinum is stable. Low lung volumes with crowding of the vasculature. No focal consolidation identified. No pleural effusion or pneumothorax visualized. IMPRESSION: Mild cardiomegaly and low lung volumes. Electronically Signed   By: Jannifer Hickelaney  Williams M.D.   On: 09/23/2021 16:36   CT Angio Chest/Abd/Pel for Dissection W and/or Wo Contrast  Result Date: 09/23/2021 CLINICAL DATA:  Sickle cell pain crisis, chest pain, abdominal pain, aortic dissection suspected EXAM: CT ANGIOGRAPHY CHEST, ABDOMEN AND PELVIS TECHNIQUE: Non-contrast CT of the chest was initially obtained. Multidetector CT imaging through the chest, abdomen and pelvis was performed using the standard protocol during bolus administration of intravenous contrast. Multiplanar reconstructed images and MIPs were obtained and reviewed to evaluate the vascular anatomy. CONTRAST:  100mL OMNIPAQUE IOHEXOL 350 MG/ML SOLN COMPARISON:  04/26/2017 FINDINGS: CTA CHEST FINDINGS Cardiovascular: Preferential opacification of the thoracic aorta. Normal contour and caliber of the thoracic aorta. No evidence of aneurysm, dissection, or other  acute aortic pathology. No significant atherosclerosis. Normal heart size. No pericardial effusion. Enlargement of the main pulmonary artery, measuring up to 3.7 cm in caliber. Mediastinum/Nodes: No enlarged mediastinal, hilar, or axillary lymph nodes. Thymic remnant in the anterior mediastinum. Thyroid gland, trachea, and esophagus demonstrate no significant findings. Lungs/Pleura: Lungs are clear. No pleural effusion or pneumothorax. Musculoskeletal: No chest wall abnormality. 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 Review of the MIP images confirms the above findings. CTA ABDOMEN AND PELVIS FINDINGS VASCULAR Normal contour and caliber of the abdominal aorta. No evidence of aneurysm, dissection, or other acute aortic pathology. Standard branching pattern of the abdominal aorta with solitary bilateral renal arteries. Review of the MIP images confirms the above findings. NON-VASCULAR Hepatobiliary: No solid liver abnormality is seen. Hepatic steatosis. Multiple small gallstones in the dependent gallbladder. Gallbladder wall thickening, or biliary dilatation. Pancreas: Unremarkable. No pancreatic ductal dilatation or surrounding inflammatory changes. Spleen: Splenomegaly, maximum coronal span 15.9 cm. Adrenals/Urinary Tract: Adrenal glands are unremarkable. Kidneys are normal, without renal calculi, solid lesion, or hydronephrosis. Bladder is unremarkable. Stomach/Bowel: Stomach is within  normal limits. Appendix appears normal. No evidence of bowel wall thickening, distention, or inflammatory changes. Lymphatic: No enlarged abdominal or pelvic lymph nodes. Reproductive: No mass or other significant abnormality. Other: No abdominal wall hernia or abnormality. No abdominopelvic ascites. Musculoskeletal: Avascular necrosis of the bilateral femoral heads. Review of the MIP images confirms the above findings. IMPRESSION: 1. Normal contour and caliber of the thoracic and abdominal aorta. No evidence of aneurysm,  dissection, or other acute aortic pathology. No significant atherosclerosis. 2. Enlargement of the main pulmonary artery, as can be seen in pulmonary hypertension. 3. Hepatic steatosis. 4. Splenomegaly, maximum coronal span 15.9 cm. 5. Cholelithiasis without evidence of acute cholecystitis. 6. 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 Electronically Signed   By: Delanna Ahmadi M.D.   On: 09/23/2021 19:36    Review of Systems  Constitutional:  Negative for activity change and appetite change.  HENT: Negative.    Eyes: Negative.   Respiratory: Negative.    Cardiovascular: Negative.   Endocrine: Negative.   Genitourinary: Negative.   Musculoskeletal:  Positive for arthralgias and back pain.  Neurological: Negative.   Psychiatric/Behavioral: Negative.    Blood pressure 132/65, pulse (!) 110, temperature 98.4 F (36.9 C), temperature source Oral, resp. rate 18, height 6' (1.829 m), weight (!) 138.8 kg, SpO2 94 %. Physical Exam Constitutional:      Appearance: Normal appearance. He is obese.  Cardiovascular:     Rate and Rhythm: Normal rate and regular rhythm.     Pulses: Normal pulses.  Pulmonary:     Effort: Pulmonary effort is normal.  Abdominal:     General: Bowel sounds are normal.  Musculoskeletal:        General: Normal range of motion.  Neurological:     Mental Status: He is alert. Mental status is at baseline.  Psychiatric:        Mood and Affect: Mood normal.        Behavior: Behavior normal.        Thought Content: Thought content normal.        Judgment: Judgment normal.    Assessment/Plan: Sickle cell disease with pain crisis: Continue IV Dilaudid PCA, no changes in settings. Initiate Toradol 15 mg IV every 6 hours for total of 5 days Folic acid 1 mg daily Discontinue normal saline, add 0.45% saline at 100 mL/h Oxycodone 10 mg every 4 hours as needed for severe breakthrough pain Monitor  vital signs very closely, reevaluate pain scale regularly, and supplemental oxygen as needed  Anemia of chronic disease: Patient's hemoglobin is stable and consistent with his baseline. LDH markedly elevated at 1660.  Repeat in AM.  Add on hemoglobin fractionation. Reviewed peripheral smear, polychromasia and teardrops.  Patient does not appear to have an increased sickle cell burden at this time. Repeat CBC with differential and LDH on 09/26/2021.  Patient may warrant simple exchange transfusion in a.m., will reassess.  Avascular necrosis of bilateral femoral heads: Appears to be secondary to sickle cell crisis.  Continue pain control.  Possible pericarditis: On admission, patient had new onset chest pain.  EKG showed ST elevations and elevated troponin.  Patient undergoing echocardiogram at bedside.  Continue pain control.  Will defer to cardiology for further work-up and evaluation.  Transaminitis: More than likely secondary to vaso-occlusive pain crisis.  Hepatic function panel in AM.  Also, CT of abdomen hepatic steatosis MRCP pending  Type 2 diabetes mellitus Stable.  Continue  SSI.  Continue home medications.  Carbohydrate modified diet   Donia Pounds  APRN, MSN, [facility] [address] [place], [place] [address] [phone]  09/25/2021, 8:59 AM

## 2021-09-25 NOTE — Plan of Care (Addendum)
Patient educated to take deep breathes, respirations are elevated, patient understands and is using incentive spirometer, patient on continuous pulse ox and 3L, patient had sustained 140s HR, patient was going to the bathroom, HR went back to 110s on resting, VS stable, RR still elevated, Dr. Heide Spark notified and aware of situation and will assess patient.  Problem: Education: Goal: Knowledge of General Education information will improve Description: Including pain rating scale, medication(s)/side effects and non-pharmacologic comfort measures Outcome: Progressing   Problem: Activity: Goal: Risk for activity intolerance will decrease Outcome: Progressing   Problem: Pain Managment: Goal: General experience of comfort will improve Outcome: Progressing   Problem: Safety: Goal: Ability to remain free from injury will improve Outcome: Progressing   Problem: Skin Integrity: Goal: Risk for impaired skin integrity will decrease Outcome: Progressing

## 2021-09-25 NOTE — Progress Notes (Signed)
Inpatient Diabetes Program Recommendations  AACE/ADA: New Consensus Statement on Inpatient Glycemic Control (2015)  Target Ranges:  Prepandial:   less than 140 mg/dL      Peak postprandial:   less than 180 mg/dL (1-2 hours)      Critically ill patients:  140 - 180 mg/dL   Lab Results  Component Value Date   GLUCAP 272 (H) 09/25/2021   HGBA1C 10.3 (H) 09/23/2021    Review of Glycemic Control  Latest Reference Range & Units 09/24/21 07:50 09/24/21 12:04 09/24/21 15:12 09/24/21 21:13 09/25/21 08:20  Glucose-Capillary 70 - 99 mg/dL 829 (H) 937 (H) 169 (H) 252 (H) 272 (H)  (H): Data is abnormally high  Diabetes history: DM2 Outpatient Diabetes medications:  Toujeo 60 units QHS Novolog 0-50 units TID  Current orders for Inpatient glycemic control:  Semglee 40 units QHS Novolog 0-20 TID  Inpatient Diabetes Program Recommendations:    Semglee 50 units QHS Novolog 0-5 units QHS  Will continue to follow while inpatient.  Thank you, Dulce Sellar, RN, BSN Diabetes Coordinator Inpatient Diabetes Program [phone] (team pager from 8a-5p)

## 2021-09-25 NOTE — TOC Initial Note (Addendum)
Transition of Care [facility]) - Initial/Assessment Note    Patient Details  Name: Ricky Cantrell MRN: [id number] Date of Birth: May 21, 1996  [facility]) CM/SW Contact:    Epifanio Lesches, RN Phone Number: 09/25/2021, 10:32 AM  Clinical Narrative:   Admitted for sickle cell crisis and possible pericarditis.            NCM spoke with pt @ bedside regarding d/c planning. Pt states attends [employer], senior year. Supportive mom.Mom resides in [place]. Pt states PTA independent with ADL's, no  DME usage. Pt states no problems affording co-pay for Rx meds. States [facility] [place], [place] manages his sickle cell disease. NCM shared [facility] for hospital f/u and pt agreeable. Appointment scheduled and noted on AVS.  Toc team will continue to monitor and assist with needs.....  Expected Discharge Plan: Home/Self Care Barriers to Discharge: Continued Medical Work up   Patient Goals and CMS Choice        Expected Discharge Plan and Services Expected Discharge Plan: Home/Self Care   Discharge Planning Services: CM Consult   Living arrangements for the past 2 months: Apartment                                      Prior Living Arrangements/Services Living arrangements for the past 2 months: Apartment Lives with:: Self Patient language and need for interpreter reviewed:: Yes Do you feel safe going back to the place where you live?: Yes      Need for Family Participation in Patient Care: Yes (Comment) Care giver support system in place?: Yes (comment)   Criminal Activity/Legal Involvement Pertinent to Current Situation/Hospitalization: No - Comment as needed  Activities of Daily Living Home Assistive Devices/Equipment: None ADL Screening (condition at time of admission) Patient's cognitive ability adequate to safely complete daily activities?: Yes Is the patient deaf or have difficulty hearing?: No Does the patient have difficulty seeing, even  when wearing glasses/contacts?: No Does the patient have difficulty concentrating, remembering, or making decisions?: No Patient able to express need for assistance with ADLs?: Yes Does the patient have difficulty dressing or bathing?: No Independently performs ADLs?: Yes (appropriate for developmental age) Does the patient have difficulty walking or climbing stairs?: No Weakness of Legs: Both Weakness of Arms/Hands: Both  Permission Sought/Granted   Permission granted to share information with : Yes, Verbal Permission Granted  Share Information with NAME: Ricky Cantrell (Mother)  [phone]           Emotional Assessment Appearance:: Appears stated age Attitude/Demeanor/Rapport: Gracious Affect (typically observed): Pleasant Orientation: : Oriented to Self, Oriented to Place, Oriented to  Time, Oriented to Situation   Psych Involvement: No (comment)  Admission diagnosis:  Sickle cell crisis (HCC) [D57.00] Hyperglycemia [R73.9] Sickle cell pain crisis (HCC) [D57.00] Chest pain, unspecified type [R07.9] Patient Active Problem List   Diagnosis Date Noted   Sickle cell crisis (HCC) 09/23/2021   PCP:  Oneita Hurt, No Pharmacy:   [facility] [phone] - [place] - [address] AT [facility] [address] [place] [address] Phone: [phone] Fax: [phone]     Social Determinants of Health (SDOH) Interventions    Readmission Risk Interventions No flowsheet data found.

## 2021-09-25 NOTE — Plan of Care (Signed)

## 2021-09-26 ENCOUNTER — Inpatient Hospital Stay (HOSPITAL_COMMUNITY): Payer: BC Managed Care – PPO

## 2021-09-26 DIAGNOSIS — D5700 Hb-SS disease with crisis, unspecified: Secondary | ICD-10-CM | POA: Diagnosis not present

## 2021-09-26 LAB — LACTATE DEHYDROGENASE: LDH: 1270 U/L — ABNORMAL HIGH (ref 98–192)

## 2021-09-26 LAB — CBC
HCT: 34.1 % — ABNORMAL LOW (ref 39.0–52.0)
Hemoglobin: 10.9 g/dL — ABNORMAL LOW (ref 13.0–17.0)
MCH: 22.6 pg — ABNORMAL LOW (ref 26.0–34.0)
MCHC: 32 g/dL (ref 30.0–36.0)
MCV: 70.6 fL — ABNORMAL LOW (ref 80.0–100.0)
Platelets: 58 10*3/uL — ABNORMAL LOW (ref 150–400)
RBC: 4.83 MIL/uL (ref 4.22–5.81)
RDW: 14.6 % (ref 11.5–15.5)
WBC: 12.3 10*3/uL — ABNORMAL HIGH (ref 4.0–10.5)
nRBC: 1.5 % — ABNORMAL HIGH (ref 0.0–0.2)

## 2021-09-26 LAB — COMPREHENSIVE METABOLIC PANEL WITH GFR
ALT: 43 U/L (ref 0–44)
AST: 54 U/L — ABNORMAL HIGH (ref 15–41)
Albumin: 2.9 g/dL — ABNORMAL LOW (ref 3.5–5.0)
Alkaline Phosphatase: 208 U/L — ABNORMAL HIGH (ref 38–126)
Anion gap: 9 (ref 5–15)
BUN: <5 mg/dL — ABNORMAL LOW (ref 6–20)
CO2: 26 mmol/L (ref 22–32)
Calcium: 8.6 mg/dL — ABNORMAL LOW (ref 8.9–10.3)
Chloride: 99 mmol/L (ref 98–111)
Creatinine, Ser: 0.84 mg/dL (ref 0.61–1.24)
GFR, Estimated: >60 mL/min (ref >60)
Glucose, Bld: 200 mg/dL — ABNORMAL HIGH (ref 70–99)
Potassium: 3.5 mmol/L (ref 3.5–5.1)
Sodium: 134 mmol/L — ABNORMAL LOW (ref 135–145)
Total Bilirubin: 1.9 mg/dL — ABNORMAL HIGH (ref 0.3–1.2)
Total Protein: 6.8 g/dL (ref 6.5–8.1)

## 2021-09-26 LAB — RETICULOCYTES
Immature Retic Fract: 44.2 % — ABNORMAL HIGH (ref 2.3–15.9)
RBC.: 4.78 MIL/uL (ref 4.22–5.81)
Retic Count, Absolute: 117.1 K/uL (ref 19.0–186.0)
Retic Ct Pct: 2.5 % (ref 0.4–3.1)

## 2021-09-26 LAB — GLUCOSE, CAPILLARY
Glucose-Capillary: 225 mg/dL — ABNORMAL HIGH (ref 70–99)
Glucose-Capillary: 232 mg/dL — ABNORMAL HIGH (ref 70–99)
Glucose-Capillary: 251 mg/dL — ABNORMAL HIGH (ref 70–99)
Glucose-Capillary: 212 mg/dL — ABNORMAL HIGH (ref 70–99)
Glucose-Capillary: 206 mg/dL — ABNORMAL HIGH (ref 70–99)

## 2021-09-26 MED ORDER — HYDROMORPHONE 1 MG/ML IV SOLN
INTRAVENOUS | Status: DC
  Filled 2021-09-26 (×5): qty 30

## 2021-09-26 MED ORDER — INSULIN ASPART 100 UNIT/ML IJ SOLN
5.0000 [IU] | Freq: Once | INTRAMUSCULAR | Status: AC
  Administered 2021-09-26: 5 [IU] via SUBCUTANEOUS

## 2021-09-26 MED ORDER — INSULIN GLARGINE-YFGN 100 UNIT/ML ~~LOC~~ SOLN
55.0000 [IU] | Freq: Every day | SUBCUTANEOUS | Status: DC
  Administered 2021-09-26: 55 [IU] via SUBCUTANEOUS
  Filled 2021-09-26 (×2): qty 0.55

## 2021-09-26 MED ORDER — ENOXAPARIN SODIUM 80 MG/0.8ML IJ SOSY: 70.0000 mg | PREFILLED_SYRINGE | INTRAMUSCULAR | Status: DC

## 2021-09-26 NOTE — Progress Notes (Signed)
Mobility Specialist Progress Note   09/26/21 1558  Mobility  Activity Contraindicated/medical hold   Per request of RN.  Frederico Hamman Mobility Specialist Phone Number [phone]

## 2021-09-26 NOTE — Progress Notes (Signed)
Changed dressing on IV in hand. Put in order for new IV. IV in a difficult place. Pt and guest are aware.

## 2021-09-26 NOTE — Progress Notes (Signed)
Progress Note  Patient Name: Ricky Cantrell Date of Encounter: 09/26/2021  [facility] Cardiologist: None   Subjective   Tired.  Denies chest pain.  Reports lower abdominal pain.   Inpatient Medications    Scheduled Meds:  colchicine  0.6 mg Oral Daily   enoxaparin (LOVENOX) injection  40 mg Subcutaneous Q24H   folic acid  1 mg Oral Daily   HYDROmorphone   Intravenous Q4H   insulin aspart  0-20 Units Subcutaneous TID WC   insulin aspart  0-5 Units Subcutaneous QHS   insulin glargine-yfgn  55 Units Subcutaneous QHS   ketorolac  15 mg Intravenous Q6H   oxyCODONE  10 mg Oral Q6H   Continuous Infusions:  sodium chloride 100 mL/hr at 09/26/21 1032   cefTRIAXone (ROCEPHIN)  IV 2 g (09/25/21 1217)   diphenhydrAMINE     PRN Meds: acetaminophen **OR** acetaminophen, diphenhydrAMINE **OR** diphenhydrAMINE, naloxone **AND** sodium chloride flush, ondansetron (ZOFRAN) IV, polyethylene glycol   Vital Signs    Vitals:   09/26/21 0708 09/26/21 0745 09/26/21 0751 09/26/21 0821  BP:   128/81   Pulse:   89   Resp: (!) 34 15    Temp:   97.8 F (36.6 C)   TempSrc:   Oral   SpO2: 98% 96% 95% 96%  Weight:      Height:        Intake/Output Summary (Last 24 hours) at 09/26/2021 1048 Last data filed at 09/25/2021 2223 Gross per 24 hour  Intake 1236.37 ml  Output 1600 ml  Net -363.63 ml   Last 3 Weights 09/23/2021 08/07/2017 04/26/2017  Weight (lbs) 306 lb 340 lb 335 lb  Weight (kg) 138.801 kg 154.223 kg 151.955 kg      Telemetry    Sinus rhythm, sinus tachycardia.  - Personally Reviewed  ECG    Sinus tachycardia.  Rate 109 bpm. - Personally Reviewed  Physical Exam   GEN: Appears uncomfortable.  Diaphoretic.  Neck: No JVD Cardiac: Tachycardic.  Regular rhythm.  No murmurs, rubs, or gallops.  Respiratory: Clear to auscultation bilaterally. GI: Soft, nontender, non-distended  MS: No edema; No deformity. Neuro:  Nonfocal  Psych: Normal affect   Labs    High  Sensitivity Troponin:   Recent Labs  Lab 09/23/21 1522 09/23/21 1722 09/23/21 2200 09/24/21 0240  TROPONINIHS 22* 38* 56* 45*     Chemistry Recent Labs  Lab 09/24/21 0240 09/25/21 0302 09/26/21 0204  NA 128* 134* 134*  K 4.6 3.7 3.5  CL 95* 102 99  CO2 22 25 26   GLUCOSE 377* 229* 200*  BUN 8 6 <5*  CREATININE 0.94 0.99 0.84  CALCIUM 8.4* 8.6* 8.6*  PROT 7.4 6.6 6.8  ALBUMIN 3.7 3.2* 2.9*  AST 92* 69* 54*  ALT 65* 54* 43  ALKPHOS 302* 285* 208*  BILITOT 2.6* 3.2* 1.9*  GFRNONAA >60 >60 >60  ANIONGAP 11 7 9     Lipids No results for input(s): CHOL, TRIG, HDL, LABVLDL, LDLCALC, CHOLHDL in the last 168 hours.  Hematology Recent Labs  Lab 09/24/21 0240 09/25/21 0302 09/26/21 0204  WBC 20.9* 11.2* 12.3*  RBC 5.82* 5.14 4.83  4.78  HGB 13.5 11.8* 10.9*  HCT 40.7 36.9* 34.1*  MCV 69.9* 71.8* 70.6*  MCH 23.2* 23.0* 22.6*  MCHC 33.2 32.0 32.0  RDW 15.0 14.6 14.6  PLT 130* 84* 58*   Thyroid No results for input(s): TSH, FREET4 in the last 168 hours.  BNPNo results for input(s): BNP, PROBNP in the last  168 hours.  DDimer No results for input(s): DDIMER in the last 168 hours.   Radiology    MR ABDOMEN MRCP WO CONTRAST  Result Date: 09/26/2021 CLINICAL DATA:  Cholelithiasis EXAM: MRI ABDOMEN WITHOUT CONTRAST  (INCLUDING MRCP) TECHNIQUE: Multiplanar multisequence MR imaging of the abdomen was performed. Heavily T2-weighted images of the biliary and pancreatic ducts were obtained, and three-dimensional MRCP images were rendered by post processing. COMPARISON:  CT abdomen and pelvis 09/23/2021 FINDINGS: Study is limited due to motion and artifacts. Lower chest: Bibasilar irregular signal abnormalities which may represent atelectasis or infiltrates. Hepatobiliary: Liver is enlarged measuring 20.9 cm in length. Evidence of hepatic steatosis. No focal hepatic mass identified. Gallbladder contains multiple small calculi. No gallbladder wall thickening or pericholecystic edema  visualized. No biliary ductal dilatation. No definite choledocholithiasis visualized. Pancreas:  No mass or ductal dilatation identified. Spleen:  Enlarged measuring up to 18 cm in length. Adrenals/Urinary Tract: Adrenal glands appear normal. Kidneys appear within normal limits. Stomach/Bowel: Visualized bowel is grossly unremarkable. Vascular/Lymphatic:  No bulky lymphadenopathy identified. Other:  No ascites visualized. Musculoskeletal: No suspicious bony lesions identified. IMPRESSION: 1. Hepatosplenomegaly. 2. Cholelithiasis. No biliary ductal dilatation or choledocholithiasis identified, limited study. 3. Bibasilar irregular signal abnormalities in the lungs which may represent atelectasis or infiltrates. Electronically Signed   By: Jannifer Hick M.D.   On: 09/26/2021 09:41   ECHOCARDIOGRAM COMPLETE  Result Date: 09/25/2021    ECHOCARDIOGRAM REPORT   Patient Name:   Ricky Cantrell Date of Exam: 09/25/2021 Medical Rec #:  [id number]    Height:       72.0 in Accession #:    [id number]   Weight:       306.0 lb Date of Birth:  December 22, 1995   BSA:          2.553 m Patient Age:    24 years     BP:           132/65 mmHg Patient Gender: M            HR:           110 bpm. Exam Location:  Inpatient Procedure: 2D Echo, Cardiac Doppler, Color Doppler and Intracardiac            Opacification Agent Indications:    R94.31 Abnormal EKG  History:        Patient has no prior history of Echocardiogram examinations.                 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  Sonographer:    Vanetta Shawl Referring Phys: [id number] EMILY B MULLEN IMPRESSIONS  1. Left ventricular ejection fraction, by estimation, is 65 to 70%. Left ventricular ejection fraction by 2D MOD biplane is 65.7 %. The left ventricle has normal function. The left ventricle has no regional wall motion abnormalities. There is moderate left ventricular hypertrophy. Left ventricular diastolic parameters were normal.  2. Right ventricular systolic function is normal.  The right ventricular size is normal.  3. The mitral valve is grossly normal. No evidence of mitral valve regurgitation.  4. The aortic valve is tricuspid. Aortic valve regurgitation is not visualized.  5. The inferior vena cava is normal in size with greater than 50% respiratory variability, suggesting right atrial pressure of 3 mmHg. Comparison(s): No prior Echocardiogram. Conclusion(s)/Recommendation(s): Normal biventricular function without evidence of hemodynamically significant valvular heart disease. FINDINGS  Left Ventricle: Left ventricular ejection fraction, by estimation, is 65 to 70%. Left ventricular ejection fraction by 2D MOD biplane is  65.7 %. The left ventricle has normal function. The left ventricle has no regional wall motion abnormalities. The left ventricular internal cavity size was normal in size. There is moderate left ventricular hypertrophy. Left ventricular diastolic parameters were normal. Right Ventricle: The right ventricular size is normal. No increase in right ventricular wall thickness. Right ventricular systolic function is normal. Left Atrium: Left atrial size was normal in size. Right Atrium: Right atrial size was normal in size. Pericardium: There is no evidence of pericardial effusion. Mitral Valve: The mitral valve is grossly normal. No evidence of mitral valve regurgitation. Tricuspid Valve: The tricuspid valve is grossly normal. Tricuspid valve regurgitation is trivial. Aortic Valve: The aortic valve is tricuspid. Aortic valve regurgitation is not visualized. Aortic valve mean gradient measures 5.0 mmHg. Aortic valve peak gradient measures 9.7 mmHg. Aortic valve area, by VTI measures 3.62 cm. Pulmonic Valve: The pulmonic valve was grossly normal. Pulmonic valve regurgitation is trivial. Aorta: The aortic root and ascending aorta are structurally normal, with no evidence of dilitation. Venous: The inferior vena cava is normal in size with greater than 50% respiratory  variability, suggesting right atrial pressure of 3 mmHg. IAS/Shunts: No atrial level shunt detected by color flow Doppler.  LEFT VENTRICLE PLAX 2D                        Biplane EF (MOD) LVIDd:         4.80 cm         LV Biplane EF:   Left LVIDs:         3.10 cm                          ventricular LV PW:         1.30 cm                          ejection LV IVS:        1.30 cm                          fraction by LVOT diam:     2.50 cm                          2D MOD LV SV:         74                               biplane is LV SV Index:   29                               65.7 %. LVOT Area:     4.91 cm                                Diastology                                LV e' medial:    10.60 cm/s LV Volumes (MOD)               LV E/e' medial:  8.9 LV vol d, MOD  117.0 ml      LV e' lateral:   14.10 cm/s A2C:                           LV E/e' lateral: 6.7 LV vol d, MOD    119.0 ml A4C: LV vol s, MOD    43.1 ml A2C: LV vol s, MOD    37.2 ml A4C: LV SV MOD A2C:   73.9 ml LV SV MOD A4C:   119.0 ml LV SV MOD BP:    78.5 ml IVC IVC diam: 1.20 cm LEFT ATRIUM           Index LA diam:      3.10 cm 1.21 cm/m LA Vol (A4C): 42.0 ml 16.45 ml/m  AORTIC VALVE                    PULMONIC VALVE AV Area (Vmax):    3.01 cm     PV Vmax:       1.25 m/s AV Area (Vmean):   3.63 cm     PV Peak grad:  6.2 mmHg AV Area (VTI):     3.62 cm AV Vmax:           155.50 cm/s AV Vmean:          99.650 cm/s AV VTI:            0.204 m AV Peak Grad:      9.7 mmHg AV Mean Grad:      5.0 mmHg LVOT Vmax:         95.30 cm/s LVOT Vmean:        73.700 cm/s LVOT VTI:          0.150 m LVOT/AV VTI ratio: 0.74  AORTA Ao Root diam: 3.00 cm Ao Asc diam:  2.60 cm MITRAL VALVE MV Area (PHT): 6.27 cm    SHUNTS MV Decel Time: 121 msec    Systemic VTI:  0.15 m MV E velocity: 94.60 cm/s  Systemic Diam: 2.50 cm MV A velocity: 85.40 cm/s MV E/A ratio:  1.11 Zoila Shutter MD Electronically signed by Zoila Shutter MD Signature Date/Time: 09/25/2021/2:51:45 PM     Final     Cardiac Studies   Echo 09/25/21:  1. Left ventricular ejection fraction, by estimation, is 65 to 70%. Left  ventricular ejection fraction by 2D MOD biplane is 65.7 %. The left  ventricle has normal function. The left ventricle has no regional wall  motion abnormalities. There is moderate  left ventricular hypertrophy. Left ventricular diastolic parameters were  normal.   2. Right ventricular systolic function is normal. The right ventricular  size is normal.   3. The mitral valve is grossly normal. No evidence of mitral valve  regurgitation.   4. The aortic valve is tricuspid. Aortic valve regurgitation is not  visualized.   5. The inferior vena cava is normal in size with greater than 50%  respiratory variability, suggesting right atrial pressure of 3 mmHg.   Patient Profile     25 y.o. male  with diabetes, SS anemia, and morbid obesity admitted with SS crisis. Cardiology consulted for chest pain and elevated troponin.   Assessment & Plan    # SS crisis: # chest pain:   Patient admitted with joint pain and chest pain consistent with SS crisis.  No clear infectious etiology, but he has a leukocytosis and fever.  Echo was completed given chest pain  and mildly elevated hs troponin.  Echo was unremarkable.  Wall motion was normal.  On my review, mild LVH.  [facility] will sign off.   Medication Recommendations:  none Other recommendations (labs, testing, etc):  none Follow up as an outpatient:  no cardiology follow up needed.       For questions or updates, please contact [facility] Please consult [email or web address] for contact info under        Signed, Chilton Si, MD  09/26/2021, 10:48 AM

## 2021-09-26 NOTE — Progress Notes (Signed)
Paged that patient has been somnolent throughout the day with PCA pump. Evaluated patient at the bedside, with mother in the room. Mother notes that the patient has been sleepy on and off throughout the day. He will periodically awake and talk with her, and has walked to the bathroom on his own, however, he falls asleep shortly after.  On my evaluation, patient does not wake to verbal stimuli, however, he does awake to sternal rub. Patient does not talk much, but states that he has been tired throughout the day. He is alert and oriented to person, place, and time. Patient frequently falls asleep upon questioning, however, is arousal to sternal rub. Nods when asked if his pain is well controlled with PCA pump.   PE: Cardiovascular: Regular rate, regular rhythm Pulm: Diminished breath sounds, but patient is protecting airway  Neuro: Arousable to sternal rub. Frequently falling asleep during interview, but easily arousable. Alert and oriented x 3.   Plan: - decrease PCA pump: stopped basal infusion rate and decrease max dose/hr from 3.5 to 2 mg - d/c oxycodone   Elza Rafter, DO Internal Medicine Resident, PGY-1 On call pager: [phone]

## 2021-09-26 NOTE — Plan of Care (Addendum)
@  1600 Patient is still extremely sleepy, respirations elevated, not taking deep breaths, educated multiple times to take deep breaths, but falls to sleep, 4L O2 sat 88-91, easy to arouse but does not stay awake. Holding his schedule oxy and toradol, patient not eating, Mom has voiced concern he is too sleepy. Notified Dr. Cyndie Chime.   Problem: Education: Goal: Knowledge of General Education information will improve Description: Including pain rating scale, medication(s)/side effects and non-pharmacologic comfort measures Outcome: Progressing   Problem: Activity: Goal: Risk for activity intolerance will decrease Outcome: Progressing   Problem: Pain Managment: Goal: General experience of comfort will improve Outcome: Progressing   Problem: Safety: Goal: Ability to remain free from injury will improve Outcome: Progressing

## 2021-09-26 NOTE — Progress Notes (Addendum)
HD#3 Subjective:  Overnight Events: NAEO   Patient seen at bedside after coming back from MRCP. He says he is still having pain in his abdomen and his back.  Objective:  Vital signs in last 24 hours: Vitals:   09/25/21 2042 09/26/21 0000 09/26/21 0542 09/26/21 0708  BP:      Pulse:      Resp: (!) 35 (!) 35 (!) 35 (!) 34  Temp:      TempSrc:      SpO2: 96% 98% 98% 98%  Weight:      Height:       Supplemental O2: Nasal Cannula SpO2: 98 % O2 Flow Rate (L/min): 3 L/min   Physical Exam:  nstitutional: Uncomfortable appearing obese young man  Eyes: conjunctiva non-erythematous Cardiovascular: tachycardic rhythm, no m/r/g Pulmonary/Chest: breath sounds diminished 2/2 body habitus Abdominal: soft, non-tender MSK: moves all extremities spontaneously Neurological: Tired appearing, answers questions with prompting Skin: warm and dry. No rashes, lesions, warmth or erythema noted Psych: flat affect  Filed Weights   09/23/21 2157  Weight: (!) 138.8 kg     Intake/Output Summary (Last 24 hours) at 09/26/2021 0717 Last data filed at 09/25/2021 2223 Gross per 24 hour  Intake 1236.37 ml  Output 2600 ml  Net -1363.63 ml   Net IO Since Admission: -1,263.63 mL [09/26/21 0717]  Pertinent Labs: CBC Latest Ref Rng & Units 09/26/2021 09/25/2021 09/24/2021  WBC 4.0 - 10.5 K/uL 12.3(H) 11.2(H) 20.9(H)  Hemoglobin 13.0 - 17.0 g/dL 10.9(L) 11.8(L) 13.5  Hematocrit 39.0 - 52.0 % 34.1(L) 36.9(L) 40.7  Platelets 150 - 400 K/uL 58(L) 84(L) 130(L)    CMP Latest Ref Rng & Units 09/26/2021 09/25/2021 09/24/2021  Glucose 70 - 99 mg/dL 200(H) 229(H) 377(H)  BUN 6 - 20 mg/dL <5(L) 6 8  Creatinine 0.61 - 1.24 mg/dL 0.84 0.99 0.94  Sodium 135 - 145 mmol/L 134(L) 134(L) 128(L)  Potassium 3.5 - 5.1 mmol/L 3.5 3.7 4.6  Chloride 98 - 111 mmol/L 99 102 95(L)  CO2 22 - 32 mmol/L '26 25 22  ' Calcium 8.9 - 10.3 mg/dL 8.6(L) 8.6(L) 8.4(L)  Total Protein 6.5 - 8.1 g/dL 6.8 6.6 7.4  Total  Bilirubin 0.3 - 1.2 mg/dL 1.9(H) 3.2(H) 2.6(H)  Alkaline Phos 38 - 126 U/L 208(H) 285(H) 302(H)  AST 15 - 41 U/L 54(H) 69(H) 92(H)  ALT 0 - 44 U/L 43 54(H) 65(H)    Imaging: ECHOCARDIOGRAM COMPLETE  Result Date: 09/25/2021    ECHOCARDIOGRAM REPORT   Patient Name:   Ricky Cantrell Date of Exam: 09/25/2021 Medical Rec #:  [id number]    Height:       72.0 in Accession #:    [id number]   Weight:       306.0 lb Date of Birth:  [birth year]   BSA:          2.553 m Patient Age:    24 years     BP:           132/65 mmHg Patient Gender: M            HR:           110 bpm. Exam Location:  Inpatient Procedure: 2D Echo, Cardiac Doppler, Color Doppler and Intracardiac            Opacification Agent Indications:    R94.31 Abnormal EKG  History:        Patient has no prior history of Echocardiogram examinations.  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  Sonographer:    Glo Herring Referring Phys: [place]  1. Left ventricular ejection fraction, by estimation, is 65 to 70%. Left ventricular ejection fraction by 2D MOD biplane is 65.7 %. The left ventricle has normal function. The left ventricle has no regional wall motion abnormalities. There is moderate left ventricular hypertrophy. Left ventricular diastolic parameters were normal.  2. Right ventricular systolic function is normal. The right ventricular size is normal.  3. The mitral valve is grossly normal. No evidence of mitral valve regurgitation.  4. The aortic valve is tricuspid. Aortic valve regurgitation is not visualized.  5. The inferior vena cava is normal in size with greater than 50% respiratory variability, suggesting right atrial pressure of 3 mmHg. Comparison(s): No prior Echocardiogram. Conclusion(s)/Recommendation(s): Normal biventricular function without evidence of hemodynamically significant valvular heart disease. FINDINGS  Left Ventricle: Left ventricular ejection fraction, by estimation, is 65 to 70%. Left  ventricular ejection fraction by 2D MOD biplane is 65.7 %. The left ventricle has normal function. The left ventricle has no regional wall motion abnormalities. The left ventricular internal cavity size was normal in size. There is moderate left ventricular hypertrophy. Left ventricular diastolic parameters were normal. Right Ventricle: The right ventricular size is normal. No increase in right ventricular wall thickness. Right ventricular systolic function is normal. Left Atrium: Left atrial size was normal in size. Right Atrium: Right atrial size was normal in size. Pericardium: There is no evidence of pericardial effusion. Mitral Valve: The mitral valve is grossly normal. No evidence of mitral valve regurgitation. Tricuspid Valve: The tricuspid valve is grossly normal. Tricuspid valve regurgitation is trivial. Aortic Valve: The aortic valve is tricuspid. Aortic valve regurgitation is not visualized. Aortic valve mean gradient measures 5.0 mmHg. Aortic valve peak gradient measures 9.7 mmHg. Aortic valve area, by VTI measures 3.62 cm. Pulmonic Valve: The pulmonic valve was grossly normal. Pulmonic valve regurgitation is trivial. Aorta: The aortic root and ascending aorta are structurally normal, with no evidence of dilitation. Venous: The inferior vena cava is normal in size with greater than 50% respiratory variability, suggesting right atrial pressure of 3 mmHg. IAS/Shunts: No atrial level shunt detected by color flow Doppler.  LEFT VENTRICLE PLAX 2D                        Biplane EF (MOD) LVIDd:         4.80 cm         LV Biplane EF:   Left LVIDs:         3.10 cm                          ventricular LV PW:         1.30 cm                          ejection LV IVS:        1.30 cm                          fraction by LVOT diam:     2.50 cm                          2D MOD LV SV:         74  biplane is LV SV Index:   29                               65.7 %. LVOT Area:     4.91 cm                                 Diastology                                LV e' medial:    10.60 cm/s LV Volumes (MOD)               LV E/e' medial:  8.9 LV vol d, MOD    117.0 ml      LV e' lateral:   14.10 cm/s A2C:                           LV E/e' lateral: 6.7 LV vol d, MOD    119.0 ml A4C: LV vol s, MOD    43.1 ml A2C: LV vol s, MOD    37.2 ml A4C: LV SV MOD A2C:   73.9 ml LV SV MOD A4C:   119.0 ml LV SV MOD BP:    78.5 ml IVC IVC diam: 1.20 cm LEFT ATRIUM           Index LA diam:      3.10 cm 1.21 cm/m LA Vol (A4C): 42.0 ml 16.45 ml/m  AORTIC VALVE                    PULMONIC VALVE AV Area (Vmax):    3.01 cm     PV Vmax:       1.25 m/s AV Area (Vmean):   3.63 cm     PV Peak grad:  6.2 mmHg AV Area (VTI):     3.62 cm AV Vmax:           155.50 cm/s AV Vmean:          99.650 cm/s AV VTI:            0.204 m AV Peak Grad:      9.7 mmHg AV Mean Grad:      5.0 mmHg LVOT Vmax:         95.30 cm/s LVOT Vmean:        73.700 cm/s LVOT VTI:          0.150 m LVOT/AV VTI ratio: 0.74  AORTA Ao Root diam: 3.00 cm Ao Asc diam:  2.60 cm MITRAL VALVE MV Area (PHT): 6.27 cm    SHUNTS MV Decel Time: 121 msec    Systemic VTI:  0.15 m MV E velocity: 94.60 cm/s  Systemic Diam: 2.50 cm MV A velocity: 85.40 cm/s MV E/A ratio:  1.11 Lyman Bishop MD Electronically signed by Lyman Bishop MD Signature Date/Time: 09/25/2021/2:51:45 PM    Final     Assessment/Plan:   Principal Problem:   Sickle cell pain crisis ([place]) Active Problems:   Chest pain   Elevated troponin   Patient Summary: Ricky Cantrell is a 25 y.o. with a pertinent PMH of PMHx of T2DM and sickle cell disease  who presented with diffuse body and admitted for sickle cell crisis and possible pericarditis.      #  Sickle Cell Pain Crisis #Fever Still on PCA as well as empiric antibiotics given leukocytosis. Patient appears very uncomfortable on exam but is not really talking very much and may be a little sedated with the opioids. Will carefully ctm his mental status.  Will continue to follow sickle cell team recommendations, their note yesterday mentioned possible exchange transfusion today.  - incentive spirometry - ceftriaxone 2g daily -PCA for pain control - consult to sickle cell team, appreciate assistance - ibuprofen 600 TID - Bcx, urine culture NGTD - IV 1/2 NS @ 100 mL/hr  -Telemetry -daily CBC CMP  #Anemia #Thrombocytopenia Patient's hemoglobin has trended down from 13.8 on admission to 10.9 likely related to patient's ongoing sickle cell crisis. -daily cbc - hold lovenox, start SCDs - ctm   #Elevated LFTs; improving #Elevated bilirubin and alkaline phosphatase; improving Hepatic steatosis and cholelithiasis seen on CTAP without evidence of acute cholecystitis. 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 MRCP did not show biliary ductal dilatation or choledocholithiasis and labs are improving today. - ctm CMP   #Possible Pericarditis #Enlargement of main pulmonary artery While in the ED, patient had new onset of stabbing chest pain radiating to the back and shoulders. EKG showed diffuse ST elevations, more consistent with pericarditis. Will start with empiric NSAID therapy which will also help with patient's pain. Cardiology has low suspicion for ACS at this time. Repeat EKG showed sinus rhythm and echo showed hyperdynamic flow with an EF of 65-70 % and moderate LVH. - 15 mg toradol Q6 -colchicine 0.6 daily -Telemetry   #T2DM Most recent A1c of 8.1 in January 2022. Patient takes Toujeo 60 U nightly and 20 units of novolog TID at home. The patient had blood sugars elevated to the 500s on admit, and these have improved to 340 most recently. 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

## 2021-09-26 NOTE — Progress Notes (Signed)
Has been very sedated with low oxygen saturations throughout shift. At 14:30 increased oxygen to 4L. Continued shallow rapid respirations. Complaints of increasing left lower quadrant abdominal pain which he states is usually eased by heat. Have requested K-pad.  At 1530 more alert and easy to arouse.

## 2021-09-26 NOTE — Progress Notes (Signed)
Inpatient Diabetes Program Recommendations  AACE/ADA: New Consensus Statement on Inpatient Glycemic Control (2015)  Target Ranges:  Prepandial:   less than 140 mg/dL      Peak postprandial:   less than 180 mg/dL (1-2 hours)      Critically ill patients:  140 - 180 mg/dL   Lab Results  Component Value Date   GLUCAP 225 (H) 09/26/2021   HGBA1C 10.3 (H) 09/23/2021    Diabetes history: DM2 Outpatient Diabetes medications:  Toujeo 60 units QHS Novolog 0-50 units TID  Current orders for Inpatient glycemic control:  Semglee 55 units QHS Novolog 0-20 TID & HS   Inpatient Diabetes Program Recommendations:    Consider further increasing Semglee to 60 units QHS (home dose) and adding Novolog 5 units TID (assuming patient is consuming >50% of meals).  Attempted to speak to patient x 3, no answer and unable to leave voicemail. Will reattempt on 11/23.   Will continue to follow while inpatient.  Thanks, Lujean Rave, MSN, RNC-OB Diabetes Coordinator [phone] (8a-5p)

## 2021-09-26 NOTE — Progress Notes (Signed)
Subjective: Ricky Cantrell is a 25 year old male with a medical history significant for sickle cell disease that was admitted and sickle cell pain crisis. Patient states that his pain is somewhat improved today.  He rates pain as 6/10 primarily to low back and lower extremities.  He states that pain has been controlled on PCA and he has not maximized PCA use. He denies any headache, chest pain, shortness of breath, urinary symptoms, nausea, vomiting, or diarrhea.  Patient's mother and college roommate are at the bedside. Objective:  Vital signs in last 24 hours:  Vitals:   09/26/21 1148 09/26/21 1234 09/26/21 1550 09/26/21 2116  BP:  128/81  134/76  Pulse:  89  92  Resp: (!) 28 (!) 22 (!) 22 16  Temp:  97.8 F (36.6 C)  98.9 F (37.2 C)  TempSrc:    Oral  SpO2: 92%  90% 100%  Weight:      Height:        Intake/Output from previous day:  No intake or output data in the 24 hours ending 09/26/21 2326  Physical Exam: General: Alert, awake, oriented x3, in no acute distress.  HEENT: [place]/AT PEERL, EOMI Neck: Trachea midline,  no masses, no thyromegal,y no JVD, no carotid bruit OROPHARYNX:  Moist, No exudate/ erythema/lesions.  Heart: Regular rate and rhythm, without murmurs, rubs, gallops, PMI non-displaced, no heaves or thrills on palpation.  Lungs: Clear to auscultation, no wheezing or rhonchi noted. No increased vocal fremitus resonant to percussion  Abdomen: Soft, nontender, nondistended, positive bowel sounds, no masses no hepatosplenomegaly noted..  Neuro: No focal neurological deficits noted cranial nerves II through XII grossly intact. DTRs 2+ bilaterally upper and lower extremities. Strength 5 out of 5 in bilateral upper and lower extremities. Musculoskeletal: No warm swelling or erythema around joints, no spinal tenderness noted. Psychiatric: Patient alert and oriented x3, good insight and cognition, good recent to remote recall. Lymph node survey: No cervical axillary or  inguinal lymphadenopathy noted.  Lab Results:  Basic Metabolic Panel:    Component Value Date/Time   NA 134 (L) 09/26/2021 0204   K 3.5 09/26/2021 0204   CL 99 09/26/2021 0204   CO2 26 09/26/2021 0204   BUN <5 (L) 09/26/2021 0204   CREATININE 0.84 09/26/2021 0204   GLUCOSE 200 (H) 09/26/2021 0204   CALCIUM 8.6 (L) 09/26/2021 0204   CBC:    Component Value Date/Time   WBC 12.3 (H) 09/26/2021 0204   HGB 10.9 (L) 09/26/2021 0204   HCT 34.1 (L) 09/26/2021 0204   PLT 58 (L) 09/26/2021 0204   MCV 70.6 (L) 09/26/2021 0204   NEUTROABS 8.2 (H) 09/23/2021 1506   LYMPHSABS 3.4 09/23/2021 1506   MONOABS 0.7 09/23/2021 1506   EOSABS 0.0 09/23/2021 1506   BASOSABS 0.1 09/23/2021 1506    Recent Results (from the past 240 hour(s))  Resp Panel by RT-PCR (Flu A&B, Covid) Nasopharyngeal Swab     Status: None   Collection Time: 09/23/21 10:16 PM   Specimen: Nasopharyngeal Swab; Nasopharyngeal(NP) swabs in vial transport medium  Result Value Ref Range Status   SARS Coronavirus 2 by RT PCR NEGATIVE NEGATIVE Final    Comment: (NOTE) SARS-CoV-2 target nucleic acids are NOT DETECTED.  The SARS-CoV-2 RNA is generally detectable in upper respiratory specimens during the acute phase of infection. The lowest concentration of SARS-CoV-2 viral copies this assay can detect is 138 copies/mL. A negative result does not preclude SARS-Cov-2 infection and should not be used as the sole basis  for treatment or other patient management decisions. A negative result may occur with  improper specimen collection/handling, submission of specimen other than nasopharyngeal swab, presence of viral mutation(s) within the areas targeted by this assay, and inadequate number of viral copies(<138 copies/mL). A negative result must be combined with clinical observations, patient history, and epidemiological information. The expected result is Negative.  Fact Sheet for Patients:   BloggerCourse.com  Fact Sheet for Healthcare Providers:  SeriousBroker.it  This test is no t yet approved or cleared by the [place] FDA and  has been authorized for detection and/or diagnosis of SARS-CoV-2 by FDA under an Emergency Use Authorization (EUA). This EUA will remain  in effect (meaning this test can be used) for the duration of the COVID-19 declaration under Section 564(b)(1) of the Act, 21 U.S.C.section 360bbb-3(b)(1), unless the authorization is terminated  or revoked sooner.       Influenza A by PCR NEGATIVE NEGATIVE Final   Influenza B by PCR NEGATIVE NEGATIVE Final    Comment: (NOTE) The Xpert Xpress SARS-CoV-2/FLU/RSV plus assay is intended as an aid in the diagnosis of influenza from Nasopharyngeal swab specimens and should not be used as a sole basis for treatment. Nasal washings and aspirates are unacceptable for Xpert Xpress SARS-CoV-2/FLU/RSV testing.  Fact Sheet for Patients: BloggerCourse.com  Fact Sheet for Healthcare Providers: SeriousBroker.it  This test is not yet approved or cleared by the [place] FDA and has been authorized for detection and/or diagnosis of SARS-CoV-2 by FDA under an Emergency Use Authorization (EUA). This EUA will remain in effect (meaning this test can be used) for the duration of the COVID-19 declaration under Section 564(b)(1) of the Act, 21 U.S.C. section 360bbb-3(b)(1), unless the authorization is terminated or revoked.  Performed at [facility], [address] [place], [place] [address]   Culture, blood (routine x 2)     Status: None (Preliminary result)   Collection Time: 09/24/21 11:02 AM   Specimen: BLOOD  Result Value Ref Range Status   Specimen Description BLOOD SITE NOT SPECIFIED  Final   Special Requests   Final    BOTTLES DRAWN AEROBIC AND ANAEROBIC Blood Culture results may not be optimal  due to an excessive volume of blood received in culture bottles   Culture   Final    NO GROWTH 2 DAYS Performed at [facility], [address] [place], [place] [address]    Report Status PENDING  Incomplete  Culture, blood (routine x 2)     Status: None (Preliminary result)   Collection Time: 09/24/21 11:09 AM   Specimen: BLOOD  Result Value Ref Range Status   Specimen Description BLOOD SITE NOT SPECIFIED  Final   Special Requests   Final    BOTTLES DRAWN AEROBIC AND ANAEROBIC Blood Culture adequate volume   Culture   Final    NO GROWTH 2 DAYS Performed at [facility], [address] [place], [place] [address]    Report Status PENDING  Incomplete  Urine Culture     Status: None   Collection Time: 09/24/21  2:21 PM   Specimen: Urine, Clean Catch  Result Value Ref Range Status   Specimen Description URINE, CLEAN CATCH  Final   Special Requests NONE  Final   Culture   Final    NO GROWTH Performed at [facility], [address] [place], [place] [address]    Report Status 09/25/2021 FINAL  Final    Studies/Results: MR  ABDOMEN MRCP WO CONTRAST  Result Date: 09/26/2021 CLINICAL DATA:  Cholelithiasis EXAM: MRI ABDOMEN WITHOUT CONTRAST  (INCLUDING MRCP) TECHNIQUE: Multiplanar multisequence MR imaging of the abdomen was performed. Heavily T2-weighted images of the biliary and pancreatic ducts were obtained, and three-dimensional MRCP images were rendered by post processing. COMPARISON:  CT abdomen and pelvis 09/23/2021 FINDINGS: Study is limited due to motion and artifacts. Lower chest: Bibasilar irregular signal abnormalities which may represent atelectasis or infiltrates. Hepatobiliary: Liver is enlarged measuring 20.9 cm in length. Evidence of hepatic steatosis. No focal hepatic mass identified. Gallbladder contains multiple small calculi. No gallbladder wall thickening or pericholecystic edema visualized. No biliary ductal dilatation. No definite  choledocholithiasis visualized. Pancreas:  No mass or ductal dilatation identified. Spleen:  Enlarged measuring up to 18 cm in length. Adrenals/Urinary Tract: Adrenal glands appear normal. Kidneys appear within normal limits. Stomach/Bowel: Visualized bowel is grossly unremarkable. Vascular/Lymphatic:  No bulky lymphadenopathy identified. Other:  No ascites visualized. Musculoskeletal: No suspicious bony lesions identified. IMPRESSION: 1. Hepatosplenomegaly. 2. Cholelithiasis. No biliary ductal dilatation or choledocholithiasis identified, limited study. 3. Bibasilar irregular signal abnormalities in the lungs which may represent atelectasis or infiltrates. Electronically Signed   By: Jannifer Hick M.D.   On: 09/26/2021 09:41   ECHOCARDIOGRAM COMPLETE  Result Date: 09/25/2021    ECHOCARDIOGRAM REPORT   Patient Name:   BRADY Lederman Date of Exam: 09/25/2021 Medical Rec #:  [id number]    Height:       72.0 in Accession #:    [id number]   Weight:       306.0 lb Date of Birth:  1996/04/03   BSA:          2.553 m Patient Age:    24 years     BP:           132/65 mmHg Patient Gender: M            HR:           110 bpm. Exam Location:  Inpatient Procedure: 2D Echo, Cardiac Doppler, Color Doppler and Intracardiac            Opacification Agent Indications:    R94.31 Abnormal EKG  History:        Patient has no prior history of Echocardiogram examinations.                 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  Sonographer:    Vanetta Shawl Referring Phys: [id number] EMILY B MULLEN IMPRESSIONS  1. Left ventricular ejection fraction, by estimation, is 65 to 70%. Left ventricular ejection fraction by 2D MOD biplane is 65.7 %. The left ventricle has normal function. The left ventricle has no regional wall motion abnormalities. There is moderate left ventricular hypertrophy. Left ventricular diastolic parameters were normal.  2. Right ventricular systolic function is normal. The right ventricular size is normal.  3. The mitral  valve is grossly normal. No evidence of mitral valve regurgitation.  4. The aortic valve is tricuspid. Aortic valve regurgitation is not visualized.  5. The inferior vena cava is normal in size with greater than 50% respiratory variability, suggesting right atrial pressure of 3 mmHg. Comparison(s): No prior Echocardiogram. Conclusion(s)/Recommendation(s): Normal biventricular function without evidence of hemodynamically significant valvular heart disease. FINDINGS  Left Ventricle: Left ventricular ejection fraction, by estimation, is 65 to 70%. Left ventricular ejection fraction by 2D MOD biplane is 65.7 %. The left ventricle has normal function. The left ventricle has no regional wall motion abnormalities. The left ventricular internal  cavity size was normal in size. There is moderate left ventricular hypertrophy. Left ventricular diastolic parameters were normal. Right Ventricle: The right ventricular size is normal. No increase in right ventricular wall thickness. Right ventricular systolic function is normal. Left Atrium: Left atrial size was normal in size. Right Atrium: Right atrial size was normal in size. Pericardium: There is no evidence of pericardial effusion. Mitral Valve: The mitral valve is grossly normal. No evidence of mitral valve regurgitation. Tricuspid Valve: The tricuspid valve is grossly normal. Tricuspid valve regurgitation is trivial. Aortic Valve: The aortic valve is tricuspid. Aortic valve regurgitation is not visualized. Aortic valve mean gradient measures 5.0 mmHg. Aortic valve peak gradient measures 9.7 mmHg. Aortic valve area, by VTI measures 3.62 cm. Pulmonic Valve: The pulmonic valve was grossly normal. Pulmonic valve regurgitation is trivial. Aorta: The aortic root and ascending aorta are structurally normal, with no evidence of dilitation. Venous: The inferior vena cava is normal in size with greater than 50% respiratory variability, suggesting right atrial pressure of 3 mmHg.  IAS/Shunts: No atrial level shunt detected by color flow Doppler.  LEFT VENTRICLE PLAX 2D                        Biplane EF (MOD) LVIDd:         4.80 cm         LV Biplane EF:   Left LVIDs:         3.10 cm                          ventricular LV PW:         1.30 cm                          ejection LV IVS:        1.30 cm                          fraction by LVOT diam:     2.50 cm                          2D MOD LV SV:         74                               biplane is LV SV Index:   29                               65.7 %. LVOT Area:     4.91 cm                                Diastology                                LV e' medial:    10.60 cm/s LV Volumes (MOD)               LV E/e' medial:  8.9 LV vol d, MOD    117.0 ml      LV e' lateral:   14.10 cm/s A2C:  LV E/e' lateral: 6.7 LV vol d, MOD    119.0 ml A4C: LV vol s, MOD    43.1 ml A2C: LV vol s, MOD    37.2 ml A4C: LV SV MOD A2C:   73.9 ml LV SV MOD A4C:   119.0 ml LV SV MOD BP:    78.5 ml IVC IVC diam: 1.20 cm LEFT ATRIUM           Index LA diam:      3.10 cm 1.21 cm/m LA Vol (A4C): 42.0 ml 16.45 ml/m  AORTIC VALVE                    PULMONIC VALVE AV Area (Vmax):    3.01 cm     PV Vmax:       1.25 m/s AV Area (Vmean):   3.63 cm     PV Peak grad:  6.2 mmHg AV Area (VTI):     3.62 cm AV Vmax:           155.50 cm/s AV Vmean:          99.650 cm/s AV VTI:            0.204 m AV Peak Grad:      9.7 mmHg AV Mean Grad:      5.0 mmHg LVOT Vmax:         95.30 cm/s LVOT Vmean:        73.700 cm/s LVOT VTI:          0.150 m LVOT/AV VTI ratio: 0.74  AORTA Ao Root diam: 3.00 cm Ao Asc diam:  2.60 cm MITRAL VALVE MV Area (PHT): 6.27 cm    SHUNTS MV Decel Time: 121 msec    Systemic VTI:  0.15 m MV E velocity: 94.60 cm/s  Systemic Diam: 2.50 cm MV A velocity: 85.40 cm/s MV E/A ratio:  1.11 Zoila Shutter MD Electronically signed by Zoila Shutter MD Signature Date/Time: 09/25/2021/2:51:45 PM    Final     Medications: Scheduled Meds:  colchicine   0.6 mg Oral Daily   folic acid  1 mg Oral Daily   HYDROmorphone   Intravenous Q4H   insulin aspart  0-20 Units Subcutaneous TID WC   insulin aspart  0-5 Units Subcutaneous QHS   insulin glargine-yfgn  55 Units Subcutaneous QHS   ketorolac  15 mg Intravenous Q6H   Continuous Infusions:  sodium chloride 100 mL/hr at 09/26/21 1032   cefTRIAXone (ROCEPHIN)  IV 2 g (09/26/21 1146)   diphenhydrAMINE     PRN Meds:.acetaminophen **OR** acetaminophen, diphenhydrAMINE **OR** diphenhydrAMINE, naloxone **AND** sodium chloride flush, ondansetron (ZOFRAN) IV, polyethylene glycol   Assessment/Plan: Principal Problem:   Sickle cell pain crisis (HCC) Active Problems:   Chest pain   Elevated troponin   Sickle cell disease with pain crisis: Patient states that pain intensity has been controlled today.  He says that it is improved from yesterday.  Mother reports that he has been somewhat sleepy.  Patient is answering all questions appropriately. No basal rate warranted, will discontinue. No demands or deliveries noted per PCA Continue Toradol 15 mg every 6 hours LDH trending down, no exchange transfusion at this time.  Defer to primary team for management. Sickle cell team will sign off.  Consult as needed.  Code Status: Full Code Family Communication: N/A Disposition Plan: Not yet ready for discharge  Shadia Larose Rennis Petty  APRN, MSN, FNP-C [facility] [address] [place]  [place], [place] [address] [phone]  If 5PM-8AM, please contact night-coverage.  09/26/2021, 11:26 PM  LOS: 3 days

## 2021-09-26 NOTE — Plan of Care (Deleted)
Reinforced information given to patient during vascular surgeon visit. Discussed vein mapping concept and placement of AV fistula as planned for tomorrow.  Verbalized understanding Consent signed for surgery Patient stated had no questions regarding procedures.

## 2021-09-26 NOTE — Progress Notes (Signed)
Progress note for next shift 11/22: NOTE change in PCA Dilaudid - DC basal rate and decrease max dose to 2mg   Continued sedation today with oxygen sats decreased to 84-91% throughout the day on 4L [place].  Held Toradol and Oxycodone today  Glucose checks average 215  LDH 1270  Minimal interaction with staff and mother today.

## 2021-09-27 ENCOUNTER — Inpatient Hospital Stay (HOSPITAL_COMMUNITY): Payer: BC Managed Care – PPO

## 2021-09-27 DIAGNOSIS — D5700 Hb-SS disease with crisis, unspecified: Secondary | ICD-10-CM | POA: Diagnosis not present

## 2021-09-27 LAB — COMPREHENSIVE METABOLIC PANEL
ALT: 36 U/L (ref 0–44)
AST: 57 U/L — ABNORMAL HIGH (ref 15–41)
Albumin: 2.8 g/dL — ABNORMAL LOW (ref 3.5–5.0)
Alkaline Phosphatase: 152 U/L — ABNORMAL HIGH (ref 38–126)
Anion gap: 9 (ref 5–15)
BUN: <5 mg/dL — ABNORMAL LOW (ref 6–20)
CO2: 25 mmol/L (ref 22–32)
Calcium: 8.3 mg/dL — ABNORMAL LOW (ref 8.9–10.3)
Chloride: 98 mmol/L (ref 98–111)
Creatinine, Ser: 0.78 mg/dL (ref 0.61–1.24)
GFR, Estimated: >60 mL/min (ref >60)
Glucose, Bld: 242 mg/dL — ABNORMAL HIGH (ref 70–99)
Potassium: 3.5 mmol/L (ref 3.5–5.1)
Sodium: 132 mmol/L — ABNORMAL LOW (ref 135–145)
Total Bilirubin: 3 mg/dL — ABNORMAL HIGH (ref 0.3–1.2)
Total Protein: 6.6 g/dL (ref 6.5–8.1)

## 2021-09-27 LAB — CBC
HCT: 31.7 % — ABNORMAL LOW (ref 39.0–52.0)
Hemoglobin: 10.8 g/dL — ABNORMAL LOW (ref 13.0–17.0)
MCH: 23.5 pg — ABNORMAL LOW (ref 26.0–34.0)
MCHC: 34.1 g/dL (ref 30.0–36.0)
MCV: 68.9 fL — ABNORMAL LOW (ref 80.0–100.0)
Platelets: 56 10*3/uL — ABNORMAL LOW (ref 150–400)
RBC: 4.6 MIL/uL (ref 4.22–5.81)
RDW: 14.6 % (ref 11.5–15.5)
WBC: 12.7 10*3/uL — ABNORMAL HIGH (ref 4.0–10.5)
nRBC: 7.3 % — ABNORMAL HIGH (ref 0.0–0.2)

## 2021-09-27 LAB — HGB FRAC BY HPLC+SOLUBILITY
Hgb A: 14 % — ABNORMAL LOW (ref 96.4–98.8)
Hgb C: 0 %
Hgb E: 0 %
Hgb F: 4.9 % — ABNORMAL HIGH (ref 0.0–2.0)
Hgb S: 74.5 % — ABNORMAL HIGH
Hgb Solubility: POSITIVE — AB
Hgb Variant: 0 %

## 2021-09-27 LAB — HGB FRACTIONATION CASCADE: Hgb A2: 6.6 % — ABNORMAL HIGH (ref 1.8–3.2)

## 2021-09-27 LAB — GLUCOSE, CAPILLARY
Glucose-Capillary: 203 mg/dL — ABNORMAL HIGH (ref 70–99)
Glucose-Capillary: 279 mg/dL — ABNORMAL HIGH (ref 70–99)
Glucose-Capillary: 238 mg/dL — ABNORMAL HIGH (ref 70–99)
Glucose-Capillary: 191 mg/dL — ABNORMAL HIGH (ref 70–99)

## 2021-09-27 MED ORDER — INSULIN GLARGINE-YFGN 100 UNIT/ML ~~LOC~~ SOLN
60.0000 [IU] | Freq: Every day | SUBCUTANEOUS | Status: DC
  Administered 2021-09-27: 60 [IU] via SUBCUTANEOUS
  Filled 2021-09-27 (×2): qty 0.6

## 2021-09-27 MED ORDER — POLYETHYLENE GLYCOL 3350 17 G PO PACK
17.0000 g | PACK | Freq: Every day | ORAL | Status: DC
  Filled 2021-09-27: qty 1

## 2021-09-27 MED ORDER — SENNOSIDES-DOCUSATE SODIUM 8.6-50 MG PO TABS
1.0000 | ORAL_TABLET | Freq: Every day | ORAL | Status: DC
Start: 2021-09-27 — End: 2021-09-28

## 2021-09-27 MED ORDER — HYDROMORPHONE HCL 1 MG/ML IJ SOLN: 1.0000 mg | INTRAMUSCULAR | Status: DC | PRN

## 2021-09-27 MED ORDER — OXYCODONE HCL 5 MG PO TABS
5.0000 mg | ORAL_TABLET | ORAL | Status: DC | PRN
  Administered 2021-09-27: 5 mg via ORAL
  Filled 2021-09-27: qty 1

## 2021-09-27 MED ORDER — AMOXICILLIN-POT CLAVULANATE 875-125 MG PO TABS: 1.0000 | ORAL_TABLET | Freq: Two times a day (BID) | ORAL | 0 refills | Status: DC

## 2021-09-27 NOTE — Progress Notes (Signed)
Wasted 50ml of hydromorphone after the PCA was discontinued with Charge RN Lauren.

## 2021-09-27 NOTE — Discharge Summary (Incomplete Revision)
Name: Ricky Cantrell MRN: [id number] DOB: [birth year] 25 y.o. PCP: Pcp, No  Date of Admission: 09/23/2021  3:00 PM Date of Discharge: 09/27/21 Attending Physician: Aldine Contes, MD  Discharge Diagnosis: 1. Sickle Cell Pain Crisis; resolved 2. CAP 3. Anemia; stable 4. Enlargement of main pulmonary artery 5. T2DM 6. Elevated LFTs; improving 7. Avascular necrosis of bilateral femoral heads 8. Elevated bilirubin and alkaline phosphatase; improving 9. Thrombocytopenia 10. Hyponatremia   Discharge Medications: Allergies as of 09/27/2021   No Known Allergies      Medication List     TAKE these medications    amoxicillin-clavulanate 875-125 MG tablet Commonly known as: Augmentin Take 1 tablet by mouth 2 (two) times daily for 1 day. Start taking on: September 28, 2021   ibuprofen 800 MG tablet Commonly known as: ADVIL Take 800 mg by mouth every 8 (eight) hours as needed for headache or moderate pain.   NovoLOG FlexPen 100 UNIT/ML FlexPen Generic drug: insulin aspart Inject 0-50 Units into the skin 3 (three) times daily with meals.   oxyCODONE 5 MG immediate release tablet Commonly known as: Oxy IR/ROXICODONE Take 5-10 mg by mouth every 6 (six) hours as needed for moderate pain.   Toujeo Max SoloStar 300 UNIT/ML Solostar Pen Generic drug: insulin glargine (2 Unit Dial) Inject 60 Units into the skin daily.        Disposition and follow-up:   [name] was discharged from [facility] in Stable condition.  At the hospital follow up visit please address:  1.  Pain- Ensure that patient's pain is under good control and he has been able to tolerate his ADLs  2. Anemia, thrombocytopenia- recheck cbc  3. LVH hypertrophy- Echo showed moderate LVH hypertrophy, continue to monitor heart function  4. Elevated liver enzymes: improved in the hospital, recheck CMP  5. T2DM- A1c of 10.3 in the hospital. Titrate medications as needed  6. New  oxygen requirement- patient was desatting with ambulation and discharged with home oxygen. Recheck sats in the clinic.  .  Labs / imaging needed at time of follow-up: cmp, cbc  6.  Pending labs/ test needing follow-up:   Follow-up Appointments:  Follow-up [facility] Follow up on 11/07/2021.   Why: post hospital follow up scheduled with Dr. Karle Plumber  on 11/07/2021 at 9:30 am Contact information: [place] [phone]                Hospital Course by problem list: 1. Sickle Cell Pain Crisis Patient came in tachycardic and diaphoretic secondary to pain. CXR was obtained which was not concerning for acute chest and CT did not show evidence of splenic rupture. WBC were slightly elevated to 14.2. He was given fluids and put on a PCA pump 2/2 high levels of pain. The sickle cell team was consulted to help coordinate his care. By 11/23 his pain was better controlled and he was discontinued from the PCA and switched back to oxycodone 5 mg Q4 with 1 mg of dilaudid Q4 PRN for breakthrough as well as toradol 15  Q6. His pain was well tolerated and he was comfortable and able to complete his ADLs by the time of discharge on 11/23.  2. CAP Patient was admitted overnight on the 19th, the morning of the 20th he began to spike fevers and his white count jumped to 20.9. He was stared on empiric ceftriaxone to cover for  encapsulated organisms. No source of infection could be identified with his UA, CXR, CTAP not identifying infectious source and skin exam was unremarkable. However MRCP showed possible infiltrates vs atelectasis in lung bases and given patient's fevers and significant white count he was continued on a course of antibiotics to cover for CAP for 5 total days (4 days of ceftriaxone and one day of augmentin). Upon discharge patient was saturating well on room air and had no shortness of breath and  was deemed stable for discharge.    3. Anemia, thrombocytopenia Thought to be related to his vasocclusive crisis. His hemoglobin was initially 13 on admission but dropped to about 10. Platelets also decreased to 50s. Exchange transfusion was considered due to high LDH though this soon downtrended and patient did not require one. Given patient's sickle cell crisis has resolved expect his counts to continue to improve.  4. Concern for pericarditis; Enlargement of main pulmonary artery When patient presented to the hospital he had chest pain with mild elevation in his troponins to the 50s which peaked and downtrended. EKG showed diffuse ST elevations rasing concern for pericarditis vs dissection. CT angio ChestA/P did not show dissection but did show enlarged pulmonary artery. Cardiology was consulted and patient was started on empiric colchicine and NSAIDs for possible pericarditis. Echo was obtained which showed EF 65-70% with moderate LVH. ST elevations normalized on repeat EKG and cardiology thought there was low suspicion for ACS. Colchicine was discontinued but toradol was continued for pain control of sickle cell crisis above.   5. T2DM Patient's A1c was 10.3 on admission. He takes 60 U of toujeo at home and 20 of novolog TID. His blood sugars were elevated to the 500s on admission. HE was put on glargine 40 initially due to concern for decreased PO intake with pain crisis but this was uptitrated to 60 daily and he had SSI with meals.  6. Elevated LFTs; Elevated bilirubin and alkaline phosphatase; Patient had elevation of his LFTs, Bili, and alk phos on admission but no pain. CT abdomen did show cholelithiasis but no obstruction. However given his enzymes continued to rise there was concern for a possible retained or passed stone so MRCP was obtained. No obstructing stones were identified and patient's enzymes continued to improve during admission  7. Avascular necrosis of bilateral femoral  heads Seen on CT, no interventions required  8. Hyponatremia Monitored daily with CMP  Discharge Exam:   BP 135/72 (BP Location: Right Arm)    Pulse 98    Temp 100.3 F (37.9 C) (Oral)    Resp (!) 30    Ht 6' (1.829 m)    Wt (!) 138.8 kg    SpO2 90%    BMI 41.50 kg/m  Discharge exam:  Constitutional: Quiet obese young man in NAD Eyes: conjunctiva non-erythematous Cardiovascular: RRR, no m/r/g Pulmonary/Chest: breath sounds diminished 2/2 body habitus Abdominal: soft, non-tender MSK: moves all extremities spontaneously Neurological: Alert, oriented, answering questions appropriately Skin: warm and dry. No rashes, lesions, warmth or erythema noted Psych: flat affect   Pertinent Labs, Studies, and Procedures:  DG Chest Port 1 View  Result Date: 09/23/2021 CLINICAL DATA:  Chest pain EXAM: PORTABLE CHEST 1 VIEW COMPARISON:  Chest x-ray 04/26/2017 FINDINGS: Heart appears mildly enlarged. Mediastinum is stable. Low lung volumes with crowding of the vasculature. No focal consolidation identified. No pleural effusion or pneumothorax visualized. IMPRESSION: Mild cardiomegaly and low lung volumes. Electronically Signed   By: Ofilia Neas M.D.   On:  09/23/2021 16:36   CT Angio Chest/Abd/Pel for Dissection W and/or Wo Contrast  Result Date: 09/23/2021 CLINICAL DATA:  Sickle cell pain crisis, chest pain, abdominal pain, aortic dissection suspected EXAM: CT ANGIOGRAPHY CHEST, ABDOMEN AND PELVIS TECHNIQUE: Non-contrast CT of the chest was initially obtained. Multidetector CT imaging through the chest, abdomen and pelvis was performed using the standard protocol during bolus administration of intravenous contrast. Multiplanar reconstructed images and MIPs were obtained and reviewed to evaluate the vascular anatomy. CONTRAST:  149m OMNIPAQUE IOHEXOL 350 MG/ML SOLN COMPARISON:  04/26/2017 FINDINGS: CTA CHEST FINDINGS Cardiovascular: Preferential opacification of the thoracic aorta. Normal contour  and caliber of the thoracic aorta. No evidence of aneurysm, dissection, or other acute aortic pathology. No significant atherosclerosis. Normal heart size. No pericardial effusion. Enlargement of the main pulmonary artery, measuring up to 3.7 cm in caliber. Mediastinum/Nodes: No enlarged mediastinal, hilar, or axillary lymph nodes. Thymic remnant in the anterior mediastinum. Thyroid gland, trachea, and esophagus demonstrate no significant findings. Lungs/Pleura: Lungs are clear. No pleural effusion or pneumothorax. Musculoskeletal: No chest wall abnormality. 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 Review of the MIP images confirms the above findings. CTA ABDOMEN AND PELVIS FINDINGS VASCULAR Normal contour and caliber of the abdominal aorta. No evidence of aneurysm, dissection, or other acute aortic pathology. Standard branching pattern of the abdominal aorta with solitary bilateral renal arteries. Review of the MIP images confirms the above findings. NON-VASCULAR Hepatobiliary: No solid liver abnormality is seen. Hepatic steatosis. Multiple small gallstones in the dependent gallbladder. Gallbladder wall thickening, or biliary dilatation. Pancreas: Unremarkable. No pancreatic ductal dilatation or surrounding inflammatory changes. Spleen: Splenomegaly, maximum coronal span 15.9 cm. Adrenals/Urinary Tract: Adrenal glands are unremarkable. Kidneys are normal, without renal calculi, solid lesion, or hydronephrosis. Bladder is unremarkable. Stomach/Bowel: Stomach is within normal limits. Appendix appears normal. No evidence of bowel wall thickening, distention, or inflammatory changes. Lymphatic: No enlarged abdominal or pelvic lymph nodes. Reproductive: No mass or other significant abnormality. Other: No abdominal wall hernia or abnormality. No abdominopelvic ascites. Musculoskeletal: Avascular necrosis of the bilateral femoral heads. Review of the MIP images confirms the above findings. IMPRESSION: 1. Normal  contour and caliber of the thoracic and abdominal aorta. No evidence of aneurysm, dissection, or other acute aortic pathology. No significant atherosclerosis. 2. Enlargement of the main pulmonary artery, as can be seen in pulmonary hypertension. 3. Hepatic steatosis. 4. Splenomegaly, maximum coronal span 15.9 cm. 5. Cholelithiasis without evidence of acute cholecystitis. 6. 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 Electronically Signed   By: ADelanna AhmadiM.D.   On: 09/23/2021 19:36    CBC Latest Ref Rng & Units 09/27/2021 09/26/2021 09/25/2021  WBC 4.0 - 10.5 K/uL 12.7(H) 12.3(H) 11.2(H)  Hemoglobin 13.0 - 17.0 g/dL 10.8(L) 10.9(L) 11.8(L)  Hematocrit 39.0 - 52.0 % 31.7(L) 34.1(L) 36.9(L)  Platelets 150 - 400 K/uL 56(L) 58(L) 84(L)   CMP Latest Ref Rng & Units 09/27/2021 09/26/2021 09/25/2021  Glucose 70 - 99 mg/dL 242(H) 200(H) 229(H)  BUN 6 - 20 mg/dL <5(L) <5(L) 6  Creatinine 0.61 - 1.24 mg/dL 0.78 0.84 0.99  Sodium 135 - 145 mmol/L 132(L) 134(L) 134(L)  Potassium 3.5 - 5.1 mmol/L 3.5 3.5 3.7  Chloride 98 - 111 mmol/L 98 99 102  CO2 22 - 32 mmol/L _0 Calcium 8.9 - 10.3 mg/dL 8.3(L) 8.6(L) 8.6(L)  Total Protein 6.5 - 8.1 g/dL 6.6 6.8 6.6  Total Bilirubin 0.3 -  1.2 mg/dL 3.0(H) 1.9(H) 3.2(H)  Alkaline Phos 38 - 126 U/L 152(H) 208(H) 285(H)  AST 15 - 41 U/L 57(H) 54(H) 69(H)  ALT 0 - 44 U/L 36 43 54(H)     Discharge Instructions: Ricky Cantrell  You were recently admitted at [facility] for a sickle cell crisis. We gave you pain medications and IV fluids to help your body get through it. You also developed a fever while in the hospital so we put you on antibiotics. Because of your blood work and your fevers we got several pictures of your stomach and chest which did not show any infection in your intestines or lungs.    Continue to take your medications as you did before coming to the hospital  with the following addition. You will complete a course of antibiotics that you started in the hospital.  Take augmentin twice a day tomorrow 11/24  If you start to experience fevers greater than 100.4, increasing pain in your muscles, inability to eat/drink, or new chest pain you should seek further medical care.  We are glad you are feeling better!   Please call our clinic to be seen in about a week to make sure you continue to feel well.   Ricky Cantrell   [phone] [address] [place] [address]    Next Steps: Schedule an appointment as soon as possible for a visit in 1 week(s)    Signed: Scarlett Presto, MD 09/27/2021, 1:39 PM   Pager: [phone]

## 2021-09-27 NOTE — Progress Notes (Signed)
Inpatient Diabetes Program Recommendations  AACE/ADA: New Consensus Statement on Inpatient Glycemic Control (2015)  Target Ranges:  Prepandial:   less than 140 mg/dL      Peak postprandial:   less than 180 mg/dL (1-2 hours)      Critically ill patients:  140 - 180 mg/dL   Lab Results  Component Value Date   GLUCAP 203 (H) 09/27/2021   HGBA1C 10.3 (H) 09/23/2021    Review of Glycemic Control  Latest Reference Range & Units 09/26/21 18:03 09/26/21 21:17 09/27/21 08:06  Glucose-Capillary 70 - 99 mg/dL 341 (H) 937 (H) 902 (H)  (H): Data is abnormally high Diabetes history: DM2 Outpatient Diabetes medications:  Toujeo 60 units QHS Novolog 0-50 units TID  Current orders for Inpatient glycemic control:  Semglee 55 units QHS Novolog 0-20 TID & HS   Inpatient Diabetes Program Recommendations:     Consider further increasing Semglee to 60 units QHS (home dose) and adding Novolog 5 units TID (assuming patient is consuming >50% of meals).  Attempted to speak to patient no answer. Secure chat sent to RN for appropriateness.   Will continue to follow while inpatient.  Thanks, Lujean Rave, MSN, RNC-OB Diabetes Coordinator [phone] (8a-5p)

## 2021-09-27 NOTE — Progress Notes (Signed)
Mobility Specialist Progress Note   09/27/21 1200  Mobility  Activity Ambulated in room  Level of Assistance Contact guard assist, steadying assist  Assistive Device None  Distance Ambulated (ft) 34 ft  Mobility Ambulated independently in room  Mobility Response Tolerated well  Mobility performed by Mobility specialist  $Mobility charge 1 Mobility   Pt more alert and oriented today, still barely speaking during session. Able to move around independently w/ small bouts of unsteadiness but quickly corrected by pt. Returned to [facility] w/ family and RN in the room.    Pre Mobility: 100 HR, 91% SpO2  During Mobility:112 HR, 84% SpO2 on RA  Post Mobility: 105 HR, 90% SpO2  Frederico Hamman Mobility Specialist Phone Number [phone]

## 2021-09-27 NOTE — Progress Notes (Signed)
SATURATION QUALIFICATIONS: (This note is used to comply with regulatory documentation for home oxygen)  Patient Saturations on Room Air at Rest = 92%  Patient Saturations on Room Air while Ambulating = 74%  Patient Saturations on 2 Liters of oxygen while Ambulating = 91%  Please briefly explain why patient needs home oxygen: not able to maintain oxygen saturation on RA while ambulating.

## 2021-09-27 NOTE — Progress Notes (Signed)
Inpatient Diabetes Program Recommendations  AACE/ADA: New Consensus Statement on Inpatient Glycemic Control (2015)  Target Ranges:  Prepandial:   less than 140 mg/dL      Peak postprandial:   less than 180 mg/dL (1-2 hours)      Critically ill patients:  140 - 180 mg/dL   Lab Results  Component Value Date   GLUCAP 279 (H) 09/27/2021   HGBA1C 10.3 (H) 09/23/2021    Spoke with pt and mom at bedside regarding glucose control at home. Pt reports checking glucose 3 times a day at home. Pt reports consistently taking insulin. Pt and mom feels it would be easier if pt had the Freestyle [place] at home. Gave pt 2 freestyle Libre 2 sensors to use outpatient at home. Pts dad is on these so they know how to use them. Pt reports his diet can be better. Discussed glucose and A1c goals. Encouraged compliance.   Thanks,  Christena Deem RN, MSN, BC-ADM Inpatient Diabetes Coordinator Team Pager [phone] (8a-5p)

## 2021-09-27 NOTE — Progress Notes (Signed)
HD#4 Subjective:  Overnight Events: NAEO  Says that he is feeling better this morning, in less pain. Slept ok overnight. Patient does not have any plans for Thanksgiving.  Objective:  Vital signs in last 24 hours: Vitals:   09/26/21 1550 09/26/21 2000 09/26/21 2116 09/27/21 0521  BP:   134/76   Pulse:   92   Resp: (!) _0 (!) 27  Temp:   98.9 F (37.2 C)   TempSrc:   Oral   SpO2: 90% 98% 100% 98%  Weight:      Height:       Supplemental O2: Room Air SpO2: 98 % O2 Flow Rate (L/min): 4.5 L/min   Physical Exam:  Constitutional: Quiet obese young man  in NAD Eyes: conjunctiva non-erythematous Cardiovascular: tachycardic rhythm, no m/r/g Pulmonary/Chest: breath sounds diminished 2/2 body habitus Abdominal: soft, non-tender MSK: moves all extremities spontaneously Neurological: Alert, oriented, answering questions appropriately Skin: warm and dry. No rashes, lesions, warmth or erythema noted Psych: flat affect  Filed Weights   09/23/21 2157  Weight: (!) 138.8 kg    No intake or output data in the 24 hours ending 09/27/21 0636 Net IO Since Admission: -1,263.63 mL [09/27/21 0636]  Pertinent Labs: CBC Latest Ref Rng & Units 09/27/2021 09/26/2021 09/25/2021  WBC 4.0 - 10.5 K/uL 12.7(H) 12.3(H) 11.2(H)  Hemoglobin 13.0 - 17.0 g/dL 10.8(L) 10.9(L) 11.8(L)  Hematocrit 39.0 - 52.0 % 31.7(L) 34.1(L) 36.9(L)  Platelets 150 - 400 K/uL 56(L) 58(L) 84(L)    CMP Latest Ref Rng & Units 09/27/2021 09/26/2021 09/25/2021  Glucose 70 - 99 mg/dL 242(H) 200(H) 229(H)  BUN 6 - 20 mg/dL <5(L) <5(L) 6  Creatinine 0.61 - 1.24 mg/dL 0.78 0.84 0.99  Sodium 135 - 145 mmol/L 132(L) 134(L) 134(L)  Potassium 3.5 - 5.1 mmol/L 3.5 3.5 3.7  Chloride 98 - 111 mmol/L 98 99 102  CO2 22 - 32 mmol/L _1 Calcium 8.9 - 10.3 mg/dL 8.3(L) 8.6(L) 8.6(L)  Total Protein 6.5 - 8.1 g/dL 6.6 6.8 6.6  Total Bilirubin 0.3 - 1.2 mg/dL 3.0(H) 1.9(H) 3.2(H)  Alkaline Phos 38 - 126 U/L 152(H)  208(H) 285(H)  AST 15 - 41 U/L 57(H) 54(H) 69(H)  ALT 0 - 44 U/L 36 43 54(H)    Imaging: MR ABDOMEN MRCP WO CONTRAST  Result Date: 09/26/2021 CLINICAL DATA:  Cholelithiasis EXAM: MRI ABDOMEN WITHOUT CONTRAST  (INCLUDING MRCP) TECHNIQUE: Multiplanar multisequence MR imaging of the abdomen was performed. Heavily T2-weighted images of the biliary and pancreatic ducts were obtained, and three-dimensional MRCP images were rendered by post processing. COMPARISON:  CT abdomen and pelvis 09/23/2021 FINDINGS: Study is limited due to motion and artifacts. Lower chest: Bibasilar irregular signal abnormalities which may represent atelectasis or infiltrates. Hepatobiliary: Liver is enlarged measuring 20.9 cm in length. Evidence of hepatic steatosis. No focal hepatic mass identified. Gallbladder contains multiple small calculi. No gallbladder wall thickening or pericholecystic edema visualized. No biliary ductal dilatation. No definite choledocholithiasis visualized. Pancreas:  No mass or ductal dilatation identified. Spleen:  Enlarged measuring up to 18 cm in length. Adrenals/Urinary Tract: Adrenal glands appear normal. Kidneys appear within normal limits. Stomach/Bowel: Visualized bowel is grossly unremarkable. Vascular/Lymphatic:  No bulky lymphadenopathy identified. Other:  No ascites visualized. Musculoskeletal: No suspicious bony lesions identified. IMPRESSION: 1. Hepatosplenomegaly. 2. Cholelithiasis. No biliary ductal dilatation or choledocholithiasis identified, limited study. 3. Bibasilar irregular signal abnormalities in the lungs which may represent atelectasis or infiltrates. Electronically Signed   By: Elberta Fortis  Jimmye Norman M.D.   On: 09/26/2021 09:41    Assessment/Plan:   Principal Problem:   Sickle cell pain crisis (Ricky Cantrell) Active Problems:   Chest pain   Elevated troponin   Patient Summary: Ricky Cantrell is a 25 y.o. with a pertinent PMH of PMHx of T2DM and sickle cell disease  who presented with  diffuse body and admitted for sickle cell crisis and possible pericarditis.    #Sickle Cell Pain Crisis #Fever Patient was more somnolent yesterday, PCA basal was discontinued and bolus was decreased to 1 mg/4hrs. Sickle cell team felt that since his LDH was improving he did not need exchange transfusion and have signed off. Will d/c PCA today and switch to oral medications with IV as breakthrough - incentive spirometry - ceftriaxone 2g daily for 5 days - d/c PCA - start Oxy 5 Q 4; dilaudid 1 mg Q4 for breakthrough - ibuprofen 600 TID - Bcx, urine culture NGTD - IV 1/2 NS @ 100 mL/hr  -Telemetry -daily CBC CMP   #Anemia; stable #Thrombocytopenia Patient's hemoglobin has trended down from 13.8 on admission to ~ 10 likely related to patient's ongoing sickle cell crisis. - daily cbc - hold lovenox, start SCDs - ctm   #Elevated LFTs; improving #Elevated bilirubin and alkaline phosphatase; improving Hepatic steatosis and cholelithiasis seen on CTAP without evidence of acute cholecystitis. 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 MRCP did not show biliary ductal dilatation or choledocholithiasis and labs are improving. - ctm CMP   #Possible Pericarditis; resolved #Enlargement of main pulmonary artery While in the ED, patient had new onset of stabbing chest pain radiating to the back and shoulders. EKG showed diffuse ST elevations, more consistent with pericarditis. Cardiology has low suspicion for ACS at this time. Repeat EKG showed sinus rhythm and echo showed hyperdynamic flow with an EF of 65-70 % and moderate LVH. Low suspicion for ongoing pericarditis at this time, will d/c colchicine but keep the toradol on board for pain as above. - d/c colchicine  - Telemetry   #T2DM Most recent A1c of 8.1 in January 2022. Patient takes Toujeo 60 U nightly and 20 units of novolog TID at home. Sugars have still been  running in the low 200s, will increase back to home dose of glargine.  -Glargine 60 units nightly with SSI resistant -CBG four times daily   #Avascular necrosis of bilateral femoral heads Secondary to sickle cell disease   #Hyponatremia - daily CMP  Scarlett Presto, MD Internal Medicine Resident PGY-1 Pager [phone] Please contact the on call pager after 5 pm and on weekends at [phone].

## 2021-09-27 NOTE — Plan of Care (Signed)

## 2021-09-27 NOTE — Discharge Summary (Addendum)
Name: Ricky Cantrell MRN: [id number] DOB: 04-07-1996 25 y.o. PCP: Pcp, No  Date of Admission: 09/23/2021  3:00 PM Date of Discharge: 09/28/21 Attending Physician: Dr. Joni Reining  Discharge Diagnosis: 1. Sickle Cell Pain Crisis; resolved 2. CAP 3. Anemia; stable 4. Enlargement of main pulmonary artery 5. T2DM 6. Elevated LFTs; improving 7. Avascular necrosis of bilateral femoral heads 8. Elevated bilirubin and alkaline phosphatase; improving 9. Thrombocytopenia 10. Hyponatremia   Discharge Medications: Allergies as of 09/28/2021   No Known Allergies      Medication List     TAKE these medications    ibuprofen 800 MG tablet Commonly known as: ADVIL Take 800 mg by mouth every 8 (eight) hours as needed for headache or moderate pain.   NovoLOG FlexPen 100 UNIT/ML FlexPen Generic drug: insulin aspart Inject 0-50 Units into the skin 3 (three) times daily with meals.   oxyCODONE 5 MG immediate release tablet Commonly known as: Oxy IR/ROXICODONE Take 5-10 mg by mouth every 6 (six) hours as needed for moderate pain.   Toujeo Max SoloStar 300 UNIT/ML Solostar Pen Generic drug: insulin glargine (2 Unit Dial) Inject 60 Units into the skin daily.        Disposition and follow-up:   Ricky Cantrell was discharged from [facility] in Stable condition.  At the hospital follow up visit please address:  1.  Pain- Ensure that patient's pain is under good control and he has been able to tolerate his ADLs  2. Anemia, thrombocytopenia- recheck cbc  3. LVH hypertrophy- Echo showed moderate LVH hypertrophy, continue to monitor heart function  4. Elevated liver enzymes: improved in the hospital, recheck CMP  5. T2DM- A1c of 10.3 in the hospital. Titrate medications as needed  6.  Labs / imaging needed at time of follow-up: cmp, cbc  7.  Pending labs/ test needing follow-up:   Follow-up Appointments:  Follow-up [place] Follow up on 11/07/2021.   Why: post hospital follow up scheduled with Dr. Karle Plumber  on 11/07/2021 at 9:30 am Contact information: [place] [id number] [phone]                Hospital Course by problem list: 1. Sickle Cell Pain Crisis Patient came in tachycardic and diaphoretic secondary to pain. CXR was obtained which was not concerning for acute chest and CT did not show evidence of splenic rupture. WBC were slightly elevated to 14.2. He was given fluids and put on a PCA pump 2/2 high levels of pain. The sickle cell team was consulted to help coordinate his care. By 11/23 his pain was better controlled and he was discontinued from the PCA and switched back to oxycodone 5 mg Q4 with 1 mg of dilaudid Q4 PRN for breakthrough as well as toradol 15  Q6. His pain was well tolerated and he was comfortable and able to complete his ADLs by the time of discharge on 11/24.  2. CAP Patient was admitted overnight on the 19th, the morning of the 20th he began to spike fevers and his white count jumped to 20.9. He was stared on empiric ceftriaxone to cover for encapsulated organisms. No source of infection could be identified with his UA, CXR, CTAP not identifying infectious source and skin exam was unremarkable. However MRCP showed possible infiltrates vs atelectasis in lung bases and given patient's fevers and significant white count he was continued on a course of  antibiotics to cover for CAP for 5 total days of ceftriaxone. Upon discharge patient was saturating well on room air and had no shortness of breath and was deemed stable for discharge.    3. Anemia, thrombocytopenia Thought to be related to his vasocclusive crisis. His hemoglobin was initially 13 on admission but dropped to about 10. Platelets also decreased to 50s but started to recover upon discharge. Exchange transfusion was considered due to high LDH though this soon  downtrended and patient did not require one. Given patient's sickle cell crisis has resolved expect his counts to continue to improve.  4. Concern for pericarditis; Enlargement of main pulmonary artery When patient presented to the hospital he had chest pain with mild elevation in his troponins to the 50s which peaked and downtrended. EKG showed diffuse ST elevations rasing concern for pericarditis vs dissection. CT angio ChestA/P did not show dissection but did show enlarged pulmonary artery. Cardiology was consulted and patient was started on empiric colchicine and NSAIDs for possible pericarditis. Echo was obtained which showed EF 65-70% with moderate LVH. ST elevations normalized on repeat EKG and cardiology thought there was low suspicion for ACS. Colchicine was discontinued but toradol was continued for pain control of sickle cell crisis above.   5. T2DM Patient's A1c was 10.3 on admission. He takes 60 U of toujeo at home and 20 of novolog TID. His blood sugars were elevated to the 500s on admission. HE was put on glargine 40 initially due to concern for decreased PO intake with pain crisis but this was uptitrated to 60 daily and he had SSI with meals.  6. Elevated LFTs; Elevated bilirubin and alkaline phosphatase; Patient had elevation of his LFTs, Bili, and alk phos on admission but no pain. CT abdomen did show cholelithiasis but no obstruction. However given his enzymes continued to rise there was concern for a possible retained or passed stone so MRCP was obtained. No obstructing stones were identified and patient's enzymes continued to improve during admission  7. Avascular necrosis of bilateral femoral heads Seen on CT, no interventions required  8. Hyponatremia Monitored daily with CMP  Discharge Exam:   BP 135/72 (BP Location: Right Arm)   Pulse 98   Temp 100.3 F (37.9 C) (Oral)   Resp (!) 30   Ht 6' (1.829 m)   Wt (!) 138.8 kg   SpO2 90%   BMI 41.50 kg/m  Discharge exam:   Constitutional: Quiet obese young man in NAD Eyes: conjunctiva non-erythematous Cardiovascular: RRR, no m/r/g Pulmonary/Chest: breath sounds diminished 2/2 body habitus Abdominal: soft, non-tender MSK: moves all extremities spontaneously Neurological: Alert, oriented, answering questions appropriately Skin: warm and dry. No rashes, lesions, warmth or erythema noted Psych: flat affect   Pertinent Labs, Studies, and Procedures:  DG Chest Port 1 View  Result Date: 09/23/2021 CLINICAL DATA:  Chest pain EXAM: PORTABLE CHEST 1 VIEW COMPARISON:  Chest x-ray 04/26/2017 FINDINGS: Heart appears mildly enlarged. Mediastinum is stable. Low lung volumes with crowding of the vasculature. No focal consolidation identified. No pleural effusion or pneumothorax visualized. IMPRESSION: Mild cardiomegaly and low lung volumes. Electronically Signed   By: Ofilia Neas M.D.   On: 09/23/2021 16:36   CT Angio Chest/Abd/Pel for Dissection W and/or Wo Contrast  Result Date: 09/23/2021 CLINICAL DATA:  Sickle cell pain crisis, chest pain, abdominal pain, aortic dissection suspected EXAM: CT ANGIOGRAPHY CHEST, ABDOMEN AND PELVIS TECHNIQUE: Non-contrast CT of the chest was initially obtained. Multidetector CT imaging through the chest, abdomen and pelvis  was performed using the standard protocol during bolus administration of intravenous contrast. Multiplanar reconstructed images and MIPs were obtained and reviewed to evaluate the vascular anatomy. CONTRAST:  14m OMNIPAQUE IOHEXOL 350 MG/ML SOLN COMPARISON:  04/26/2017 FINDINGS: CTA CHEST FINDINGS Cardiovascular: Preferential opacification of the thoracic aorta. Normal contour and caliber of the thoracic aorta. No evidence of aneurysm, dissection, or other acute aortic pathology. No significant atherosclerosis. Normal heart size. No pericardial effusion. Enlargement of the main pulmonary artery, measuring up to 3.7 cm in caliber. Mediastinum/Nodes: No enlarged  mediastinal, hilar, or axillary lymph nodes. Thymic remnant in the anterior mediastinum. Thyroid gland, trachea, and esophagus demonstrate no significant findings. Lungs/Pleura: Lungs are clear. No pleural effusion or pneumothorax. Musculoskeletal: No chest wall abnormality. 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 Review of the MIP images confirms the above findings. CTA ABDOMEN AND PELVIS FINDINGS VASCULAR Normal contour and caliber of the abdominal aorta. No evidence of aneurysm, dissection, or other acute aortic pathology. Standard branching pattern of the abdominal aorta with solitary bilateral renal arteries. Review of the MIP images confirms the above findings. NON-VASCULAR Hepatobiliary: No solid liver abnormality is seen. Hepatic steatosis. Multiple small gallstones in the dependent gallbladder. Gallbladder wall thickening, or biliary dilatation. Pancreas: Unremarkable. No pancreatic ductal dilatation or surrounding inflammatory changes. Spleen: Splenomegaly, maximum coronal span 15.9 cm. Adrenals/Urinary Tract: Adrenal glands are unremarkable. Kidneys are normal, without renal calculi, solid lesion, or hydronephrosis. Bladder is unremarkable. Stomach/Bowel: Stomach is within normal limits. Appendix appears normal. No evidence of bowel wall thickening, distention, or inflammatory changes. Lymphatic: No enlarged abdominal or pelvic lymph nodes. Reproductive: No mass or other significant abnormality. Other: No abdominal wall hernia or abnormality. No abdominopelvic ascites. Musculoskeletal: Avascular necrosis of the bilateral femoral heads. Review of the MIP images confirms the above findings. IMPRESSION: 1. Normal contour and caliber of the thoracic and abdominal aorta. No evidence of aneurysm, dissection, or other acute aortic pathology. No significant atherosclerosis. 2. Enlargement of the main pulmonary artery, as can be seen in pulmonary hypertension. 3. Hepatic steatosis. 4. Splenomegaly,  maximum coronal span 15.9 cm. 5. Cholelithiasis without evidence of acute cholecystitis. 6. 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 Electronically Signed   By: ADelanna AhmadiM.D.   On: 09/23/2021 19:36    CBC Latest Ref Rng & Units 09/28/2021 09/27/2021 09/26/2021  WBC 4.0 - 10.5 K/uL 11.6(H) 12.7(H) 12.3(H)  Hemoglobin 13.0 - 17.0 g/dL 10.0(L) 10.8(L) 10.9(L)  Hematocrit 39.0 - 52.0 % 29.7(L) 31.7(L) 34.1(L)  Platelets 150 - 400 K/uL 69(L) 56(L) 58(L)   CMP Latest Ref Rng & Units 09/28/2021 09/27/2021 09/26/2021  Glucose 70 - 99 mg/dL 289(H) 242(H) 200(H)  BUN 6 - 20 mg/dL 10 <5(L) <5(L)  Creatinine 0.61 - 1.24 mg/dL 0.89 0.78 0.84  Sodium 135 - 145 mmol/L 132(L) 132(L) 134(L)  Potassium 3.5 - 5.1 mmol/L 3.3(L) 3.5 3.5  Chloride 98 - 111 mmol/L 98 98 99  CO2 22 - 32 mmol/L _0 Calcium 8.9 - 10.3 mg/dL 8.3(L) 8.3(L) 8.6(L)  Total Protein 6.5 - 8.1 g/dL 6.6 6.6 6.8  Total Bilirubin 0.3 - 1.2 mg/dL 4.2(H) 3.0(H) 1.9(H)  Alkaline Phos 38 - 126 U/L 155(H) 152(H) 208(H)  AST 15 - 41 U/L 58(H) 57(H) 54(H)  ALT 0 - 44 U/L 35 36 43     Discharge Instructions: SLala Cantrell [facility] We  gave you pain medications and IV fluids to help your body get through it. You also developed a fever while in the hospital so we put you on antibiotics. Because of your blood work and your fevers we got several pictures of your stomach and chest which did not show any infection in your intestines or lungs.    Continue to take your medications as you did before coming to the hospital.  If you start to experience fevers greater than 100.4, increasing pain in your muscles, inability to eat/drink, or new chest pain you should seek further medical care.  We are glad you are feeling better!   Please call our clinic to be seen in about a week to make sure you  continue to feel well.   Ricky Cantrell   [phone] [address] [address]    Next Steps: Schedule an appointment as soon as possible for a visit in 1 week(s)    Signed: Scarlett Presto, MD 09/27/2021, 1:39 PM   Pager: [phone]

## 2021-09-27 NOTE — Discharge Instructions (Addendum)
[facility]  You were recently admitted at [facility] for a sickle cell crisis. We gave you pain medications and IV fluids to help your body get through it. You also developed a fever while in the hospital so we put you on antibiotics. Because of your blood work and your fevers we got several pictures of your stomach and chest which did not show any infection in your intestines or lungs.    Continue to take your medications as you did before coming to the hospital.  If you start to experience fevers greater than 100.4, increasing pain in your muscles, inability to eat/drink, or new chest pain you should seek further medical care.  We are glad you are feeling better!

## 2021-09-27 NOTE — Plan of Care (Signed)

## 2021-09-28 LAB — COMPREHENSIVE METABOLIC PANEL
ALT: 35 U/L (ref 0–44)
AST: 58 U/L — ABNORMAL HIGH (ref 15–41)
Albumin: 2.7 g/dL — ABNORMAL LOW (ref 3.5–5.0)
Alkaline Phosphatase: 155 U/L — ABNORMAL HIGH (ref 38–126)
Anion gap: 7 (ref 5–15)
BUN: 10 mg/dL (ref 6–20)
CO2: 27 mmol/L (ref 22–32)
Calcium: 8.3 mg/dL — ABNORMAL LOW (ref 8.9–10.3)
Chloride: 98 mmol/L (ref 98–111)
Creatinine, Ser: 0.89 mg/dL (ref 0.61–1.24)
GFR, Estimated: >60 mL/min (ref >60)
Glucose, Bld: 289 mg/dL — ABNORMAL HIGH (ref 70–99)
Potassium: 3.3 mmol/L — ABNORMAL LOW (ref 3.5–5.1)
Sodium: 132 mmol/L — ABNORMAL LOW (ref 135–145)
Total Bilirubin: 4.2 mg/dL — ABNORMAL HIGH (ref 0.3–1.2)
Total Protein: 6.6 g/dL (ref 6.5–8.1)

## 2021-09-28 LAB — HGB FRACTIONATION CASCADE: Hgb A2: 6.5 % — ABNORMAL HIGH (ref 1.8–3.2)

## 2021-09-28 LAB — HGB FRAC BY HPLC+SOLUBILITY
Hgb A: 14 % — ABNORMAL LOW (ref 96.4–98.8)
Hgb C: 0 %
Hgb E: 0 %
Hgb F: 5.2 % — ABNORMAL HIGH (ref 0.0–2.0)
Hgb S: 74.3 % — ABNORMAL HIGH
Hgb Solubility: POSITIVE — AB
Hgb Variant: 0 %

## 2021-09-28 LAB — CBC
HCT: 29.7 % — ABNORMAL LOW (ref 39.0–52.0)
Hemoglobin: 10 g/dL — ABNORMAL LOW (ref 13.0–17.0)
MCH: 23.1 pg — ABNORMAL LOW (ref 26.0–34.0)
MCHC: 33.7 g/dL (ref 30.0–36.0)
MCV: 68.8 fL — ABNORMAL LOW (ref 80.0–100.0)
Platelets: 69 10*3/uL — ABNORMAL LOW (ref 150–400)
RBC: 4.32 MIL/uL (ref 4.22–5.81)
RDW: 14.4 % (ref 11.5–15.5)
WBC: 11.6 10*3/uL — ABNORMAL HIGH (ref 4.0–10.5)
nRBC: 12 % — ABNORMAL HIGH (ref 0.0–0.2)

## 2021-09-28 LAB — GLUCOSE, CAPILLARY: Glucose-Capillary: 217 mg/dL — ABNORMAL HIGH (ref 70–99)

## 2021-09-28 MED ORDER — SODIUM CHLORIDE 0.9 % IV SOLN
2.0000 g | INTRAVENOUS | Status: AC
  Administered 2021-09-28: 2 g via INTRAVENOUS
  Filled 2021-09-28: qty 20

## 2021-09-28 MED ORDER — POTASSIUM CHLORIDE CRYS ER 20 MEQ PO TBCR
40.0000 meq | EXTENDED_RELEASE_TABLET | Freq: Once | ORAL | Status: AC
  Administered 2021-09-28: 40 meq via ORAL
  Filled 2021-09-28: qty 2

## 2021-09-28 NOTE — Progress Notes (Signed)
SATURATION QUALIFICATIONS: (This note is used to comply with regulatory documentation for home oxygen)  Patient Saturations on Room Air at Rest = 99%  Patient Saturations on Room Air while Ambulating = 91%  Patient Saturations on 2 Liters of oxygen while Ambulating = 99%  Please briefly explain why patient needs home oxygen: N/A

## 2021-09-28 NOTE — Progress Notes (Signed)
Ambulated one lap in the hallway without oxygen without episode of hypoxia. Oxygen saturation remained 96% to 99% on RA upon ambulation in the two long hallways.

## 2021-09-28 NOTE — Plan of Care (Signed)

## 2021-09-28 NOTE — Progress Notes (Signed)
Discharge instruction given to the patient and his mother and both stated understanding. Questions and concerns answered. Tele monitor and IV discontinued.  1135am: Patient discharged with his mom. Patient was offered a wheel chair for discharge but patient refused and stated that he would like to walk to the car. Patient hemodynamically stable during discharge.

## 2021-09-29 LAB — CULTURE, BLOOD (ROUTINE X 2)
Culture: NO GROWTH
Culture: NO GROWTH
Special Requests: ADEQUATE

## 2021-10-04 ENCOUNTER — Encounter: Payer: Self-pay | Admitting: Internal Medicine

## 2021-10-04 ENCOUNTER — Other Ambulatory Visit: Payer: Self-pay

## 2021-10-04 ENCOUNTER — Ambulatory Visit (INDEPENDENT_AMBULATORY_CARE_PROVIDER_SITE_OTHER): Payer: BC Managed Care – PPO | Admitting: Internal Medicine

## 2021-10-04 VITALS — BP 143/97 | HR 118 | Temp 98.9°F | Ht 72.0 in | Wt 299.3 lb

## 2021-10-04 DIAGNOSIS — Z794 Long term (current) use of insulin: Secondary | ICD-10-CM

## 2021-10-04 DIAGNOSIS — D5700 Hb-SS disease with crisis, unspecified: Secondary | ICD-10-CM

## 2021-10-04 DIAGNOSIS — E119 Type 2 diabetes mellitus without complications: Secondary | ICD-10-CM | POA: Insufficient documentation

## 2021-10-04 LAB — GLUCOSE, CAPILLARY: Glucose-Capillary: 241 mg/dL — ABNORMAL HIGH (ref 70–99)

## 2021-10-04 MED ORDER — NOVOLOG FLEXPEN 100 UNIT/ML ~~LOC~~ SOPN: 0.0000 [IU] | PEN_INJECTOR | Freq: Three times a day (TID) | SUBCUTANEOUS | 2 refills | Status: DC

## 2021-10-04 MED ORDER — TOUJEO MAX SOLOSTAR 300 UNIT/ML ~~LOC~~ SOPN
60.0000 [IU] | PEN_INJECTOR | Freq: Every day | SUBCUTANEOUS | 0 refills | Status: DC
Start: 2021-10-04 — End: 2022-03-09

## 2021-10-04 MED ORDER — TOUJEO MAX SOLOSTAR 300 UNIT/ML ~~LOC~~ SOPN: 60.0000 [IU] | PEN_INJECTOR | Freq: Every day | SUBCUTANEOUS | 0 refills | Status: DC

## 2021-10-04 MED ORDER — NOVOLOG FLEXPEN 100 UNIT/ML ~~LOC~~ SOPN: 0.0000 [IU] | PEN_INJECTOR | Freq: Three times a day (TID) | SUBCUTANEOUS | 2 refills | Status: AC

## 2021-10-04 NOTE — Assessment & Plan Note (Signed)
The patient is on Toujeo 60 U daily and novolog 0-50 U with meals. He has been compliant with his medications. He reports that his sugars have gotten to as high as 296 after meals. Sugars in the AM are normally in the 190s. Did not bring meter today. Pt prefers to follow-up with his PCP in [place].  -Refilled Toujeo and novolog today -Glucose, A1c today

## 2021-10-04 NOTE — Assessment & Plan Note (Addendum)
The patient is here today for a hospital follow-up. He endorses adequate pain control at home with ibuprofen and is able to perform all ADLs. Pt is established with his PCP in [place], [place] and would like to follow-up there from now on.  -CBC today -CMP today

## 2021-10-04 NOTE — Patient Instructions (Addendum)
Thank you, [name] for allowing [place] to provide your care today. Today we discussed your hospital visit.  1) I have refilled your diabetes medications. Call us at [phone] if you have any issues with this.  2) I would like to get your blood drawn to see your blood counts. I will call you if these are abnormal.  I have ordered the following labs for you:   Lab Orders         CBC no Diff         CMP14 + Anion Gap        Referrals ordered today:   Referral Orders  No referral(s) requested today     I have ordered the following medication/changed the following medications:   Stop the following medications: Medications Discontinued During This Encounter  Medication Reason   TOUJEO MAX SOLOSTAR 300 UNIT/ML Solostar Pen Reorder   insulin aspart (NOVOLOG FLEXPEN) 100 UNIT/ML FlexPen Reorder   TOUJEO MAX SOLOSTAR 300 UNIT/ML Solostar Pen    insulin aspart (NOVOLOG FLEXPEN) 100 UNIT/ML FlexPen      Start the following medications: Meds ordered this encounter  Medications   DISCONTD: TOUJEO MAX SOLOSTAR 300 UNIT/ML Solostar Pen    Sig: Inject 60 Units into the skin daily.    Dispense:  18 mL    Refill:  0   DISCONTD: insulin aspart (NOVOLOG FLEXPEN) 100 UNIT/ML FlexPen    Sig: Inject 0-50 Units into the skin 3 (three) times daily with meals.    Dispense:  21 mL    Refill:  2   insulin aspart (NOVOLOG FLEXPEN) 100 UNIT/ML FlexPen    Sig: Inject 0-50 Units into the skin 3 (three) times daily with meals.    Dispense:  21 mL    Refill:  2   TOUJEO MAX SOLOSTAR 300 UNIT/ML Solostar Pen    Sig: Inject 60 Units into the skin daily.    Dispense:  18 mL    Refill:  0     Follow up:  PRN . Follow-up with primary care doctor in [place], [place].  Should you have any questions or concerns please call the internal medicine clinic at [phone].

## 2021-10-04 NOTE — Progress Notes (Signed)
   CC: hospital follow-up  HPI:  [name] is a 25 y.o. with past medical history as noted below who presents to the clinic today for a hospital follow-up. Please see problem-based list for further details, assessments, and plans.   Past Medical History:  Diagnosis Date   Sickle cell anemia (HCC)    Review of Systems:  Negative aside from that listed in individualized problem based charting.   Physical Exam:  Vitals:   10/04/21 0851  BP: (!) 143/97  Pulse: (!) 118  Temp: 98.9 F (37.2 C)  TempSrc: Oral  SpO2: 99%  Weight: 299 lb 4.8 oz (135.8 kg)  Height: 6' (1.829 m)   Physical Exam Constitutional:      General: He is not in acute distress.    Appearance: Normal appearance.  HENT:     Head: Normocephalic and atraumatic.  Eyes:     Extraocular Movements: Extraocular movements intact.     Pupils: Pupils are equal, round, and reactive to light.  Cardiovascular:     Rate and Rhythm: Normal rate and regular rhythm.     Heart sounds: No murmur heard.   No friction rub. No gallop.  Pulmonary:     Effort: Pulmonary effort is normal.     Breath sounds: Normal breath sounds. No wheezing, rhonchi or rales.  Abdominal:     General: Abdomen is flat. There is no distension.  Musculoskeletal:        General: No tenderness. Normal range of motion.  Skin:    General: Skin is warm and dry.  Neurological:     General: No focal deficit present.     Mental Status: He is alert and oriented to person, place, and time. Mental status is at baseline.  Psychiatric:        Mood and Affect: Mood normal.        Behavior: Behavior normal.     Assessment & Plan:   See Encounters Tab for problem based charting.  Patient seen with Dr. Mikey Bussing

## 2021-10-05 LAB — CMP14 + ANION GAP
ALT: 67 IU/L — ABNORMAL HIGH (ref 0–44)
AST: 43 IU/L — ABNORMAL HIGH (ref 0–40)
Albumin/Globulin Ratio: 1.1 — ABNORMAL LOW (ref 1.2–2.2)
Albumin: 3.9 g/dL — ABNORMAL LOW (ref 4.1–5.2)
Alkaline Phosphatase: 148 IU/L — ABNORMAL HIGH (ref 44–121)
Anion Gap: 15 mmol/L (ref 10.0–18.0)
BUN/Creatinine Ratio: 9 (ref 9–20)
BUN: 6 mg/dL (ref 6–20)
Bilirubin Total: 1.6 mg/dL — ABNORMAL HIGH (ref 0.0–1.2)
CO2: 22 mmol/L (ref 20–29)
Calcium: 9.4 mg/dL (ref 8.7–10.2)
Chloride: 101 mmol/L (ref 96–106)
Creatinine, Ser: 0.7 mg/dL — ABNORMAL LOW (ref 0.76–1.27)
Globulin, Total: 3.6 g/dL (ref 1.5–4.5)
Glucose: 216 mg/dL — ABNORMAL HIGH (ref 70–99)
Potassium: 4.2 mmol/L (ref 3.5–5.2)
Sodium: 138 mmol/L (ref 134–144)
Total Protein: 7.5 g/dL (ref 6.0–8.5)
eGFR: 134 mL/min/{1.73_m2} (ref >59)

## 2021-10-05 LAB — CBC
Hematocrit: 35.3 % — ABNORMAL LOW (ref 37.5–51.0)
Hemoglobin: 10.7 g/dL — ABNORMAL LOW (ref 13.0–17.7)
MCH: 22.2 pg — ABNORMAL LOW (ref 26.6–33.0)
MCHC: 30.3 g/dL — ABNORMAL LOW (ref 31.5–35.7)
MCV: 73 fL — ABNORMAL LOW (ref 79–97)
NRBC: 1 % — ABNORMAL HIGH (ref 0–0)
Platelets: 404 10*3/uL (ref 150–450)
RBC: 4.81 x10E6/uL (ref 4.14–5.80)
RDW: 17.2 % — ABNORMAL HIGH (ref 11.6–15.4)
WBC: 8.3 10*3/uL (ref 3.4–10.8)

## 2021-10-06 NOTE — Progress Notes (Signed)
Internal Medicine Clinic Attending ? ?I saw and evaluated the patient.  I personally confirmed the key portions of the history and exam documented by Dr. Bonanno and I reviewed pertinent patient test results.  The assessment, diagnosis, and plan were formulated together and I agree with the documentation in the resident?s note. ? ?

## 2021-11-07 ENCOUNTER — Inpatient Hospital Stay: Payer: BC Managed Care – PPO | Admitting: Internal Medicine

## 2022-03-08 ENCOUNTER — Other Ambulatory Visit: Payer: Self-pay | Admitting: Internal Medicine

## 2022-03-08 DIAGNOSIS — Z794 Long term (current) use of insulin: Secondary | ICD-10-CM

## 2022-03-08 NOTE — Telephone Encounter (Signed)
No future appoints scheduled in the Clinics.  No showed appointment in January. ?

## 2022-12-16 IMAGING — MR MR MRCP
7 of 12 series · 22 of 48 positions shown · non-contrast
Comparison: CT abdomen and pelvis 09/23/2021

CLINICAL DATA: Cholelithiasis

EXAM:
MRI ABDOMEN WITHOUT CONTRAST  (INCLUDING MRCP)
TECHNIQUE: Multiplanar multisequence MR imaging of the abdomen was performed.
Heavily T2-weighted images of the biliary and pancreatic ducts were
obtained, and three-dimensional MRCP images were rendered by post
processing.

[Series 3: cor ssfse nav · coronal · 6.0mm · 0.86mm/px · 2 of 38 slices shown]
[im 1/38]
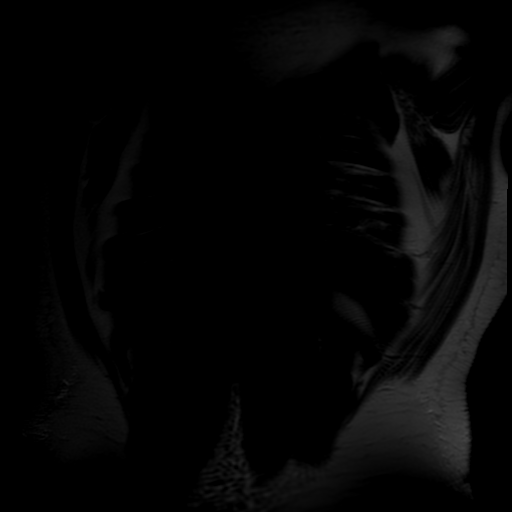
[im 38/38]
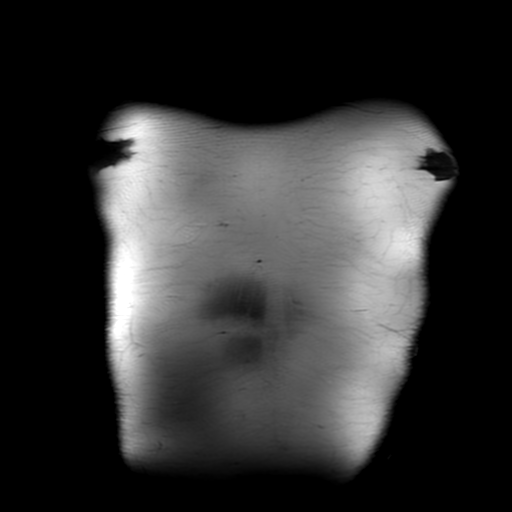

[Series 4: ax ssfse nav · axial · 6.0mm · 0.82mm/px · z∈[-57,+177]mm · 3 of 40 slices shown]
[im 1/40]
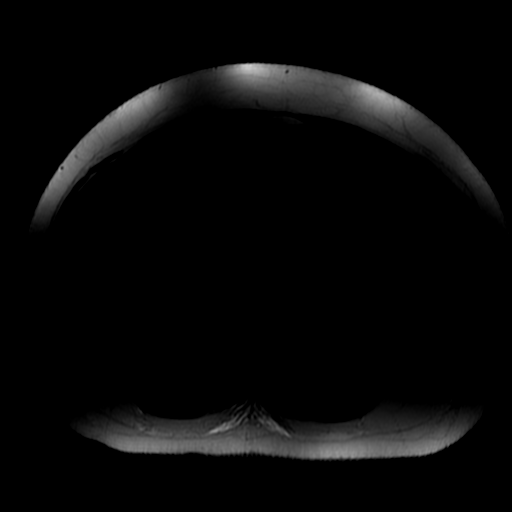
[im 20/40]
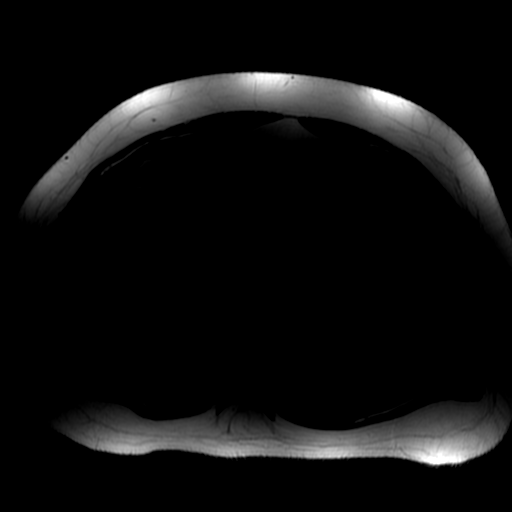
[im 40/40]
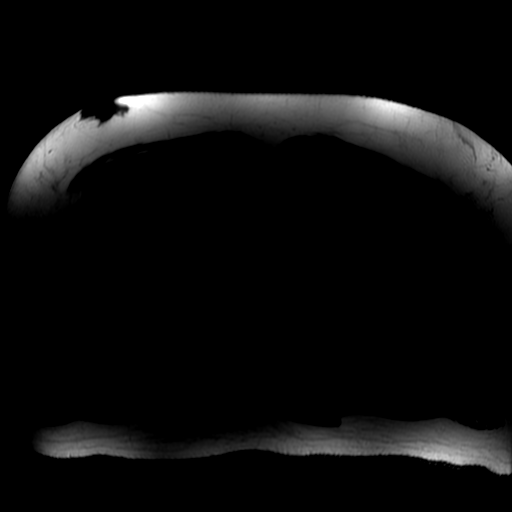

[Series 5: T2 fat-sat · axial · 6.0mm · 0.82mm/px · z∈[-57,+177]mm · 3 of 40 slices shown]
[im 1/40]
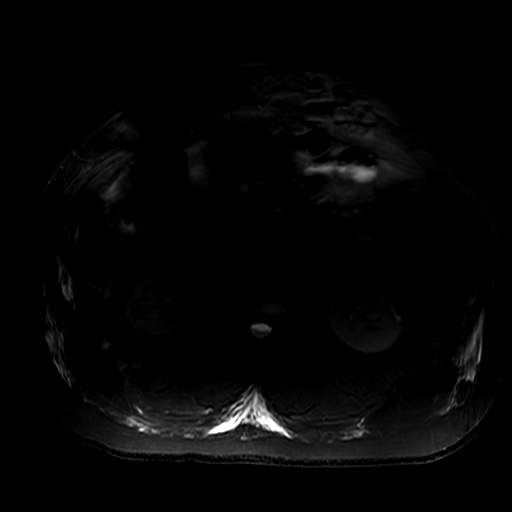
[im 20/40]
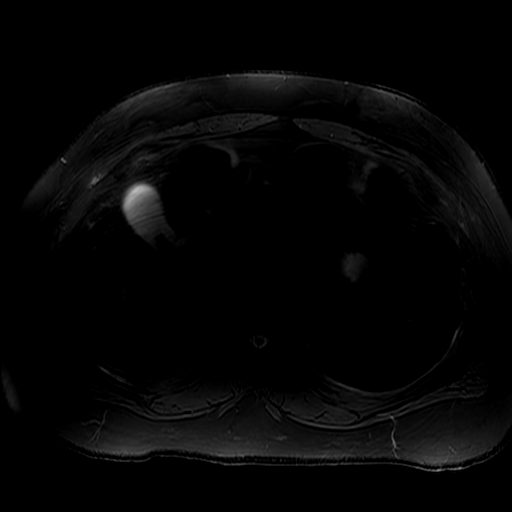
[im 40/40]
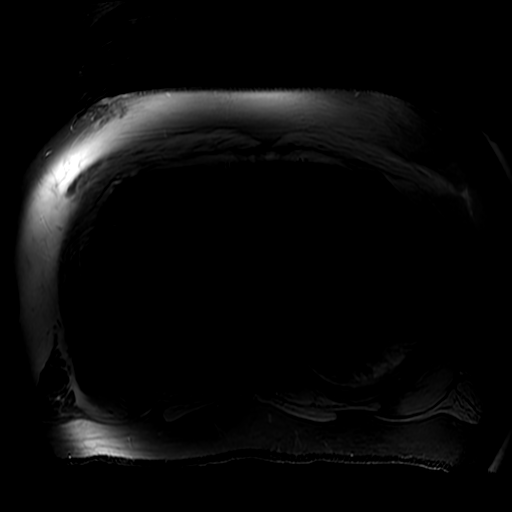

[Series 8: radial 2d thick · sagittal · 40.0mm · 0.86mm/px · 1 of 6 slices shown]
[im 1/6]
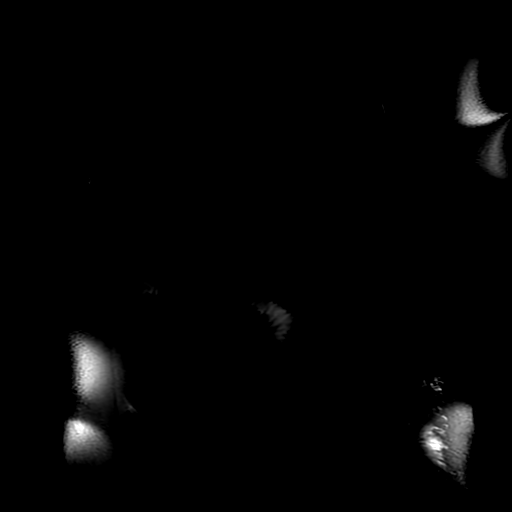

[Series 9: DWI b500 · axial · 8.0mm · 1.56mm/px · z∈[-112,+198]mm · 4 of 64 slices shown]
[im 1/64]
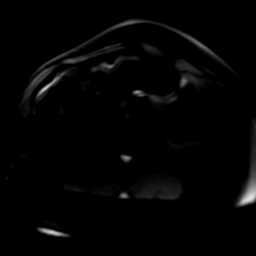
[im 22/64]
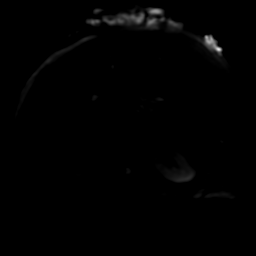
[im 43/64]
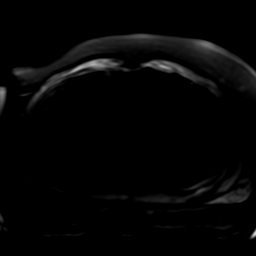
[im 64/64]
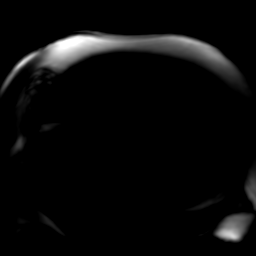

[Series 10: T1 dynamic · axial · 5.0mm · 0.82mm/px · z∈[-88,+189]mm · 7 of 112 slices shown]
[im 1/112]
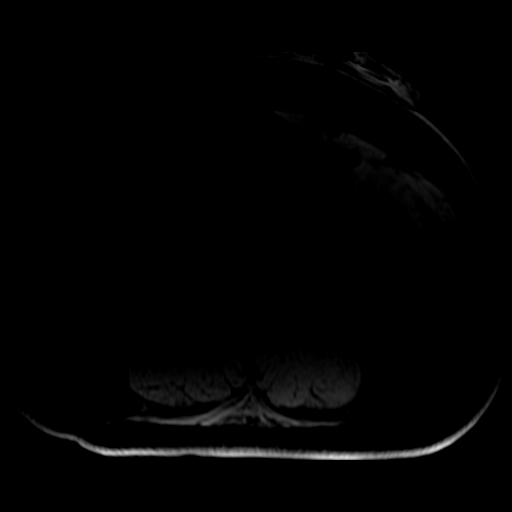
[im 19/112]
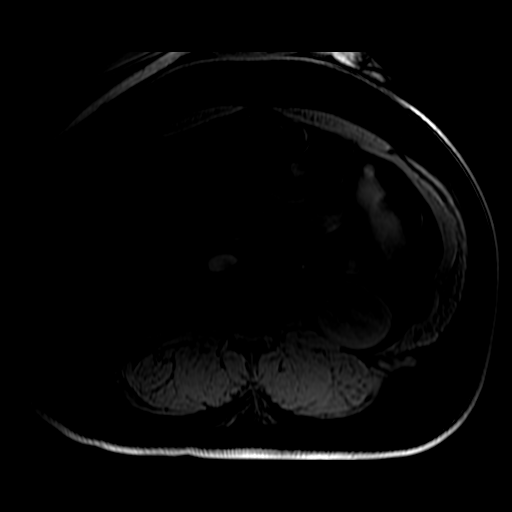
[im 38/112]
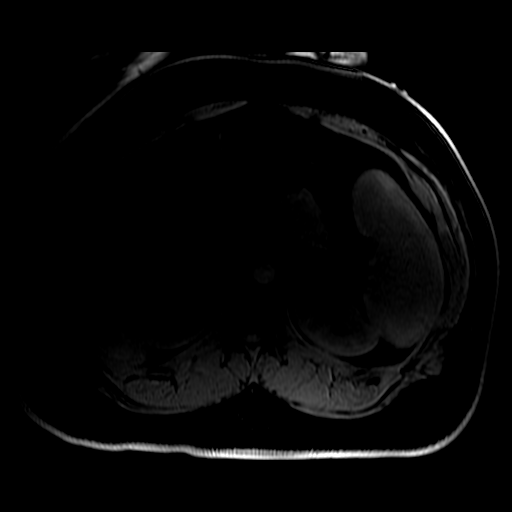
[im 56/112]
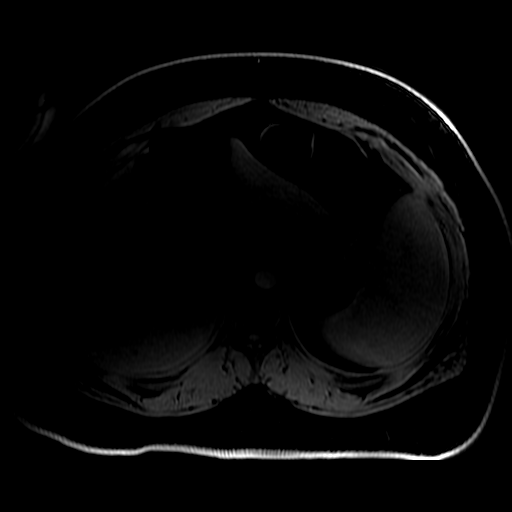
[im 75/112]
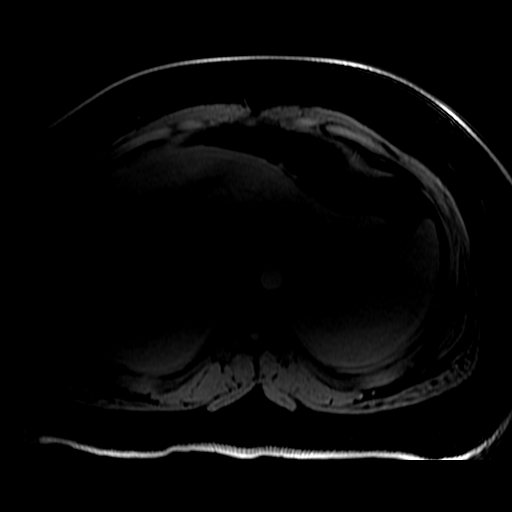
[im 93/112]
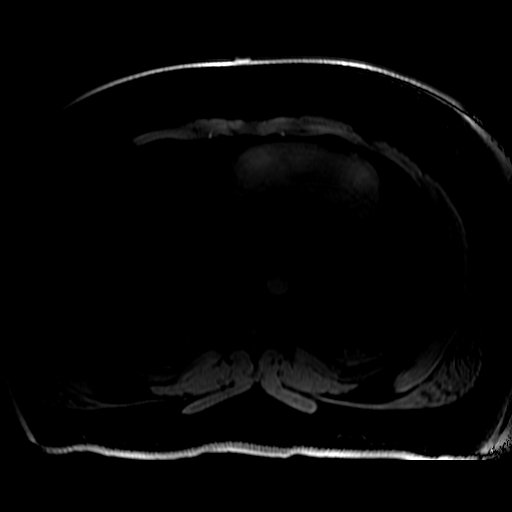
[im 112/112]
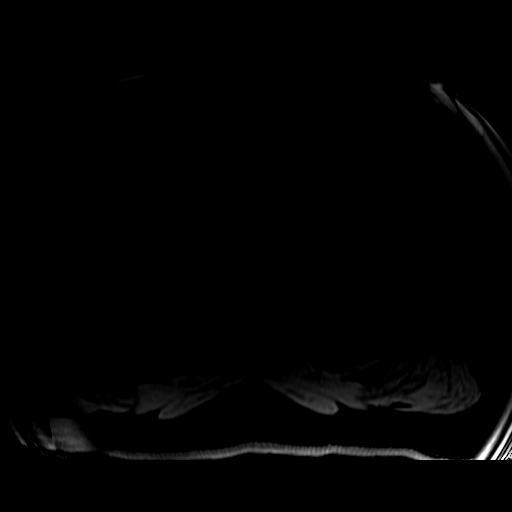

[Series 11: bSSFP · coronal · 6.0mm · 0.82mm/px · 2 of 38 slices shown]
[im 1/38]
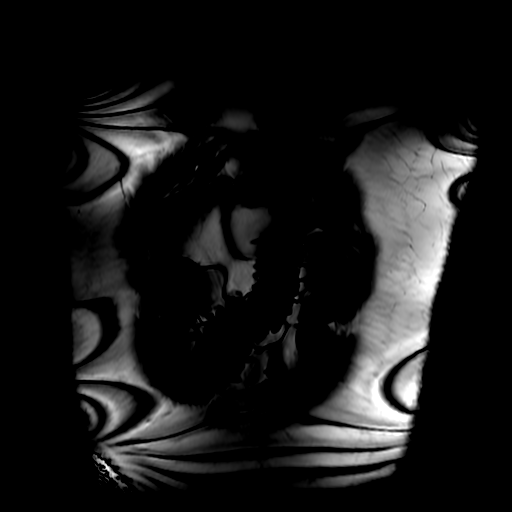
[im 38/38]
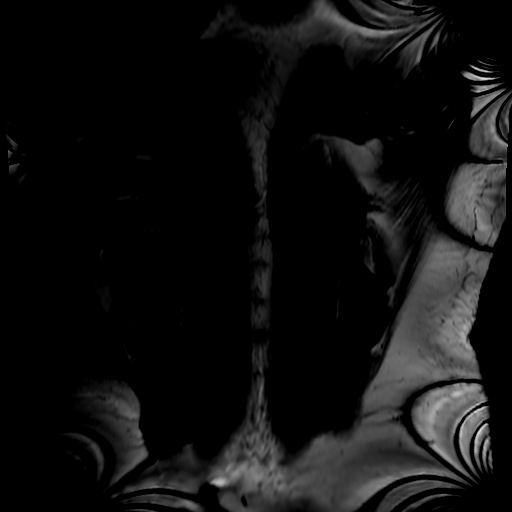

[22 of 48 positions shown; findings below may reference images not displayed]

FINDINGS: Study is limited due to motion and artifacts.

Lower chest: Bibasilar irregular signal abnormalities which may
represent atelectasis or infiltrates.

Hepatobiliary: Liver is enlarged measuring 20.9 cm in length.
Evidence of hepatic steatosis. No focal hepatic mass identified.
Gallbladder contains multiple small calculi. No gallbladder wall
thickening or pericholecystic edema visualized. No biliary ductal
dilatation. No definite choledocholithiasis visualized.

Pancreas:  No mass or ductal dilatation identified.

Spleen:  Enlarged measuring up to 18 cm in length.

Adrenals/Urinary Tract: Adrenal glands appear normal. Kidneys appear
within normal limits.

Stomach/Bowel: Visualized bowel is grossly unremarkable.

Vascular/Lymphatic:  No bulky lymphadenopathy identified.

Other:  No ascites visualized.

Musculoskeletal: No suspicious bony lesions identified.
IMPRESSION: 1. Hepatosplenomegaly.
2. Cholelithiasis. No biliary ductal dilatation or
choledocholithiasis identified, limited study.
3. Bibasilar irregular signal abnormalities in the lungs which may
represent atelectasis or infiltrates.

## 2022-12-17 IMAGING — DX DG CHEST 1V PORT
1 series · 1 of 1 positions shown · non-contrast
Comparison: 09/23/2021.

CLINICAL DATA: Sickle cell pain, shortness of breath.

EXAM:
PORTABLE CHEST 1 VIEW

[chest ap]
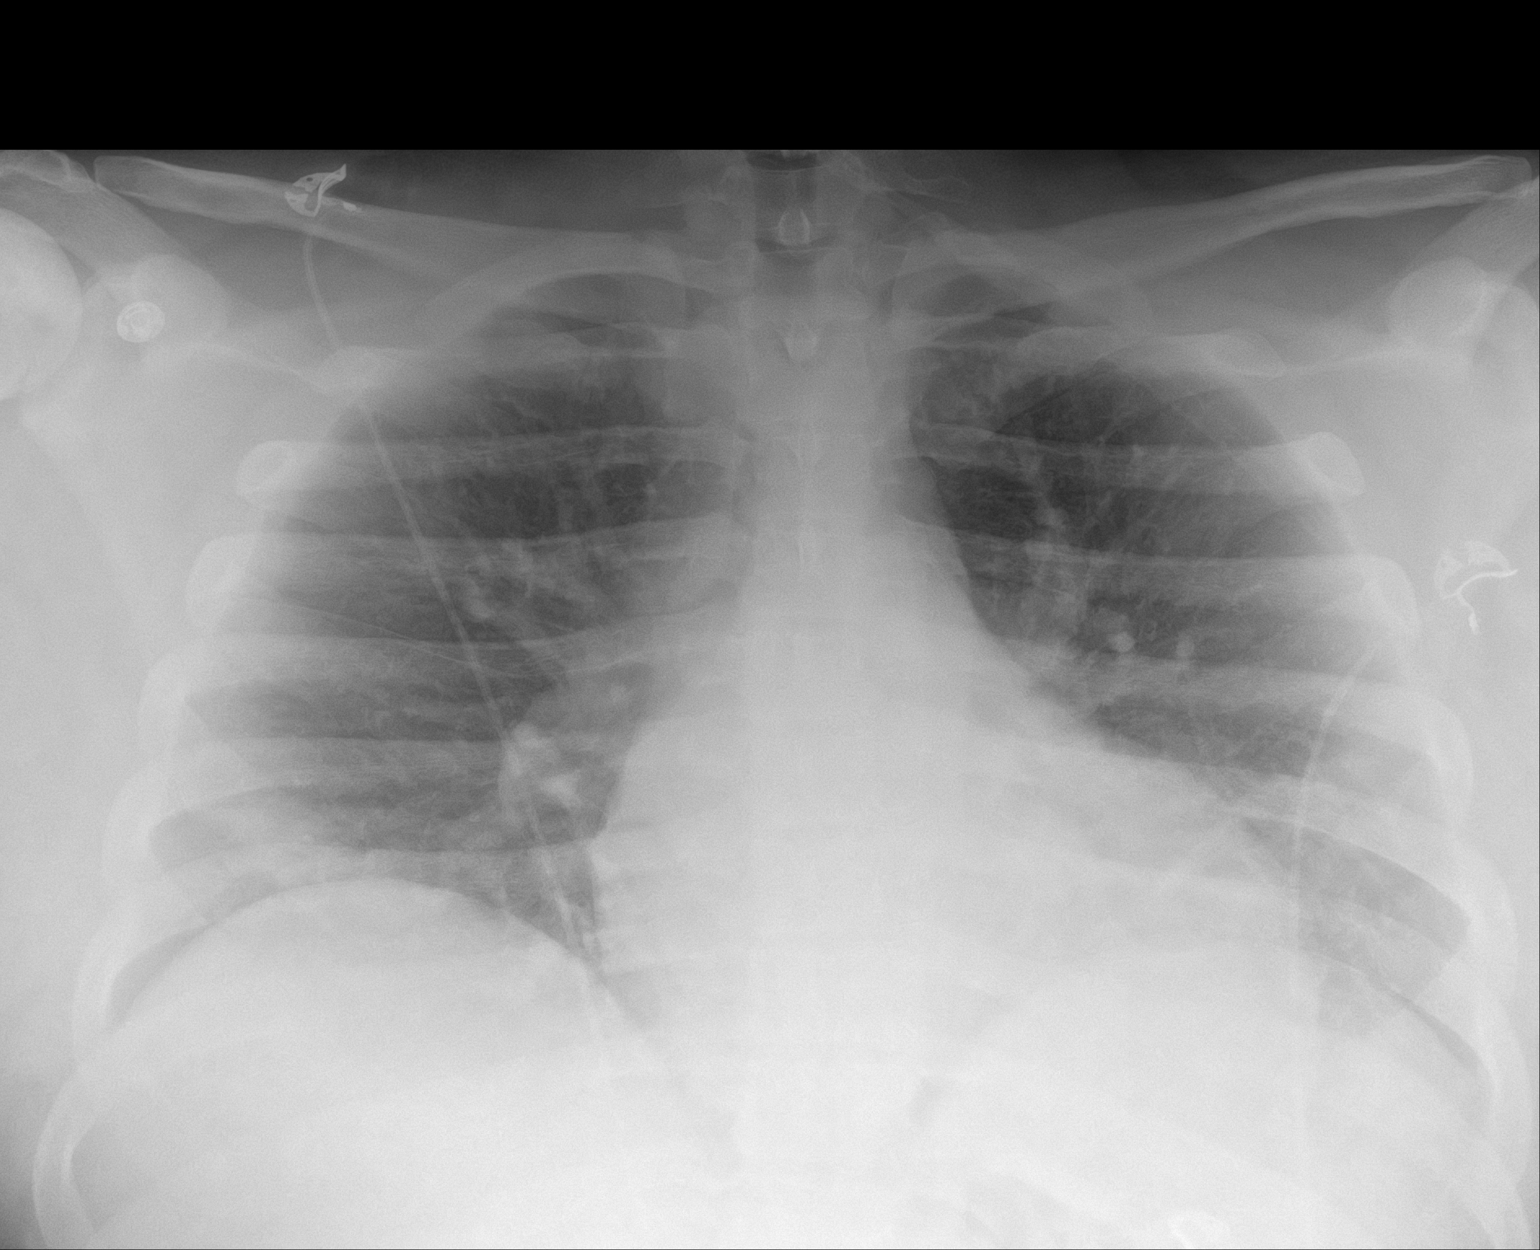

[1 of 1 positions shown; findings below may reference images not displayed]

FINDINGS: The heart is borderline enlarged and the pulmonary vasculature is
distended. The mediastinal structures are otherwise within normal
limits. Lung volumes are low with mild airspace disease at the lung
bases. No effusion or pneumothorax is seen. No acute osseous
abnormality.
IMPRESSION: 1. Borderline cardiomegaly with distended pulmonary vasculature.
2. Low lung volumes with atelectasis or infiltrate at the lung
bases.

## 2023-01-31 ENCOUNTER — Other Ambulatory Visit: Payer: Self-pay | Admitting: Obstetrics and Gynecology

## 2023-01-31 ENCOUNTER — Ambulatory Visit
Admission: RE | Admit: 2023-01-31 | Discharge: 2023-01-31 | Disposition: A | Discharge status: Home / Self Care | Payer: No Typology Code available for payment source | Source: Ambulatory Visit | Attending: Obstetrics and Gynecology | Admitting: Obstetrics and Gynecology

## 2023-01-31 DIAGNOSIS — R7612 Nonspecific reaction to cell mediated immunity measurement of gamma interferon antigen response without active tuberculosis: Secondary | ICD-10-CM

## 2023-01-31 DIAGNOSIS — R7611 Nonspecific reaction to tuberculin skin test without active tuberculosis: Secondary | ICD-10-CM
